# Patient Record
Sex: Female | Born: 1970 | Race: White | Hispanic: No | Marital: Married | Smoking: Never smoker
Health system: Southern US, Community
[De-identification: ages and names within clinical notes are randomized; demographics above are authoritative.]

## PROBLEM LIST (undated history)

## (undated) DIAGNOSIS — A4902 Methicillin resistant Staphylococcus aureus infection, unspecified site: Secondary | ICD-10-CM

## (undated) DIAGNOSIS — Z960 Presence of urogenital implants: Secondary | ICD-10-CM

## (undated) DIAGNOSIS — Z803 Family history of malignant neoplasm of breast: Secondary | ICD-10-CM

## (undated) DIAGNOSIS — Q059 Spina bifida, unspecified: Secondary | ICD-10-CM

## (undated) DIAGNOSIS — N390 Urinary tract infection, site not specified: Secondary | ICD-10-CM

## (undated) DIAGNOSIS — D500 Iron deficiency anemia secondary to blood loss (chronic): Secondary | ICD-10-CM

## (undated) DIAGNOSIS — L89302 Pressure ulcer of unspecified buttock, stage 2: Secondary | ICD-10-CM

## (undated) HISTORY — PX: BACK SURGERY: SHX140

## (undated) HISTORY — DX: Spina bifida, unspecified: Q05.9

## (undated) HISTORY — DX: Presence of urogenital implants: Z96.0

## (undated) HISTORY — DX: Methicillin resistant Staphylococcus aureus infection, unspecified site: A49.02

## (undated) HISTORY — PX: FOOT SURGERY: SHX648

## (undated) HISTORY — DX: Urinary tract infection, site not specified: N39.0

## (undated) HISTORY — DX: Family history of malignant neoplasm of breast: Z80.3

## (undated) HISTORY — DX: Pressure ulcer of unspecified buttock, stage 2: L89.302

---
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
## 1994-04-21 HISTORY — PX: TUBAL LIGATION: SHX77

## 1999-03-26 LAB — HM PAP SMEAR: HM Pap smear: NORMAL

## 2004-04-21 DIAGNOSIS — Z960 Presence of urogenital implants: Secondary | ICD-10-CM

## 2004-04-21 HISTORY — DX: Presence of urogenital implants: Z96.0

## 2006-06-01 ENCOUNTER — Emergency Department: Payer: Self-pay | Admitting: Internal Medicine

## 2006-06-01 ENCOUNTER — Other Ambulatory Visit: Payer: Self-pay

## 2010-04-21 HISTORY — PX: HIP SURGERY: SHX245

## 2011-08-01 DIAGNOSIS — E559 Vitamin D deficiency, unspecified: Secondary | ICD-10-CM | POA: Diagnosis not present

## 2011-08-01 DIAGNOSIS — Z1231 Encounter for screening mammogram for malignant neoplasm of breast: Secondary | ICD-10-CM | POA: Diagnosis not present

## 2011-08-01 DIAGNOSIS — Z1239 Encounter for other screening for malignant neoplasm of breast: Secondary | ICD-10-CM | POA: Diagnosis not present

## 2011-08-01 DIAGNOSIS — Z1389 Encounter for screening for other disorder: Secondary | ICD-10-CM | POA: Diagnosis not present

## 2011-08-01 DIAGNOSIS — Z01419 Encounter for gynecological examination (general) (routine) without abnormal findings: Secondary | ICD-10-CM | POA: Diagnosis not present

## 2011-08-12 DIAGNOSIS — N390 Urinary tract infection, site not specified: Secondary | ICD-10-CM | POA: Diagnosis not present

## 2012-05-21 DIAGNOSIS — J069 Acute upper respiratory infection, unspecified: Secondary | ICD-10-CM | POA: Diagnosis not present

## 2012-09-27 DIAGNOSIS — Z111 Encounter for screening for respiratory tuberculosis: Secondary | ICD-10-CM | POA: Diagnosis not present

## 2012-09-27 DIAGNOSIS — Z23 Encounter for immunization: Secondary | ICD-10-CM | POA: Diagnosis not present

## 2012-09-27 DIAGNOSIS — Z124 Encounter for screening for malignant neoplasm of cervix: Secondary | ICD-10-CM | POA: Diagnosis not present

## 2012-10-28 DIAGNOSIS — Z111 Encounter for screening for respiratory tuberculosis: Secondary | ICD-10-CM | POA: Diagnosis not present

## 2012-10-28 DIAGNOSIS — Z23 Encounter for immunization: Secondary | ICD-10-CM | POA: Diagnosis not present

## 2013-01-07 DIAGNOSIS — R51 Headache: Secondary | ICD-10-CM | POA: Diagnosis not present

## 2013-01-07 DIAGNOSIS — M9981 Other biomechanical lesions of cervical region: Secondary | ICD-10-CM | POA: Diagnosis not present

## 2013-01-07 DIAGNOSIS — M26609 Unspecified temporomandibular joint disorder, unspecified side: Secondary | ICD-10-CM | POA: Diagnosis not present

## 2013-01-07 DIAGNOSIS — M25519 Pain in unspecified shoulder: Secondary | ICD-10-CM | POA: Diagnosis not present

## 2013-01-07 DIAGNOSIS — M5020 Other cervical disc displacement, unspecified cervical region: Secondary | ICD-10-CM | POA: Diagnosis not present

## 2013-01-07 DIAGNOSIS — M999 Biomechanical lesion, unspecified: Secondary | ICD-10-CM | POA: Diagnosis not present

## 2013-01-07 DIAGNOSIS — G44209 Tension-type headache, unspecified, not intractable: Secondary | ICD-10-CM | POA: Diagnosis not present

## 2013-01-07 DIAGNOSIS — M546 Pain in thoracic spine: Secondary | ICD-10-CM | POA: Diagnosis not present

## 2013-01-10 DIAGNOSIS — G44209 Tension-type headache, unspecified, not intractable: Secondary | ICD-10-CM | POA: Diagnosis not present

## 2013-01-10 DIAGNOSIS — M999 Biomechanical lesion, unspecified: Secondary | ICD-10-CM | POA: Diagnosis not present

## 2013-01-10 DIAGNOSIS — M25519 Pain in unspecified shoulder: Secondary | ICD-10-CM | POA: Diagnosis not present

## 2013-01-10 DIAGNOSIS — M26609 Unspecified temporomandibular joint disorder, unspecified side: Secondary | ICD-10-CM | POA: Diagnosis not present

## 2013-01-10 DIAGNOSIS — M9981 Other biomechanical lesions of cervical region: Secondary | ICD-10-CM | POA: Diagnosis not present

## 2013-01-10 DIAGNOSIS — M546 Pain in thoracic spine: Secondary | ICD-10-CM | POA: Diagnosis not present

## 2013-01-10 DIAGNOSIS — M5020 Other cervical disc displacement, unspecified cervical region: Secondary | ICD-10-CM | POA: Diagnosis not present

## 2013-01-10 DIAGNOSIS — R51 Headache: Secondary | ICD-10-CM | POA: Diagnosis not present

## 2013-01-12 DIAGNOSIS — M9981 Other biomechanical lesions of cervical region: Secondary | ICD-10-CM | POA: Diagnosis not present

## 2013-01-12 DIAGNOSIS — G44209 Tension-type headache, unspecified, not intractable: Secondary | ICD-10-CM | POA: Diagnosis not present

## 2013-01-12 DIAGNOSIS — M26609 Unspecified temporomandibular joint disorder, unspecified side: Secondary | ICD-10-CM | POA: Diagnosis not present

## 2013-01-12 DIAGNOSIS — M999 Biomechanical lesion, unspecified: Secondary | ICD-10-CM | POA: Diagnosis not present

## 2013-01-12 DIAGNOSIS — R51 Headache: Secondary | ICD-10-CM | POA: Diagnosis not present

## 2013-01-12 DIAGNOSIS — M5020 Other cervical disc displacement, unspecified cervical region: Secondary | ICD-10-CM | POA: Diagnosis not present

## 2013-01-12 DIAGNOSIS — M546 Pain in thoracic spine: Secondary | ICD-10-CM | POA: Diagnosis not present

## 2013-01-12 DIAGNOSIS — M25519 Pain in unspecified shoulder: Secondary | ICD-10-CM | POA: Diagnosis not present

## 2013-01-14 DIAGNOSIS — M546 Pain in thoracic spine: Secondary | ICD-10-CM | POA: Diagnosis not present

## 2013-01-14 DIAGNOSIS — G44209 Tension-type headache, unspecified, not intractable: Secondary | ICD-10-CM | POA: Diagnosis not present

## 2013-01-14 DIAGNOSIS — M26609 Unspecified temporomandibular joint disorder, unspecified side: Secondary | ICD-10-CM | POA: Diagnosis not present

## 2013-01-14 DIAGNOSIS — M999 Biomechanical lesion, unspecified: Secondary | ICD-10-CM | POA: Diagnosis not present

## 2013-01-14 DIAGNOSIS — R51 Headache: Secondary | ICD-10-CM | POA: Diagnosis not present

## 2013-01-14 DIAGNOSIS — M9981 Other biomechanical lesions of cervical region: Secondary | ICD-10-CM | POA: Diagnosis not present

## 2013-01-14 DIAGNOSIS — M5020 Other cervical disc displacement, unspecified cervical region: Secondary | ICD-10-CM | POA: Diagnosis not present

## 2013-01-14 DIAGNOSIS — M25519 Pain in unspecified shoulder: Secondary | ICD-10-CM | POA: Diagnosis not present

## 2013-01-17 DIAGNOSIS — M9981 Other biomechanical lesions of cervical region: Secondary | ICD-10-CM | POA: Diagnosis not present

## 2013-01-17 DIAGNOSIS — M5020 Other cervical disc displacement, unspecified cervical region: Secondary | ICD-10-CM | POA: Diagnosis not present

## 2013-01-17 DIAGNOSIS — M25519 Pain in unspecified shoulder: Secondary | ICD-10-CM | POA: Diagnosis not present

## 2013-01-17 DIAGNOSIS — R51 Headache: Secondary | ICD-10-CM | POA: Diagnosis not present

## 2013-01-17 DIAGNOSIS — G44209 Tension-type headache, unspecified, not intractable: Secondary | ICD-10-CM | POA: Diagnosis not present

## 2013-01-17 DIAGNOSIS — M546 Pain in thoracic spine: Secondary | ICD-10-CM | POA: Diagnosis not present

## 2013-01-17 DIAGNOSIS — M999 Biomechanical lesion, unspecified: Secondary | ICD-10-CM | POA: Diagnosis not present

## 2013-01-17 DIAGNOSIS — M26609 Unspecified temporomandibular joint disorder, unspecified side: Secondary | ICD-10-CM | POA: Diagnosis not present

## 2013-01-19 DIAGNOSIS — G44209 Tension-type headache, unspecified, not intractable: Secondary | ICD-10-CM | POA: Diagnosis not present

## 2013-01-19 DIAGNOSIS — M25519 Pain in unspecified shoulder: Secondary | ICD-10-CM | POA: Diagnosis not present

## 2013-01-19 DIAGNOSIS — M999 Biomechanical lesion, unspecified: Secondary | ICD-10-CM | POA: Diagnosis not present

## 2013-01-19 DIAGNOSIS — M26609 Unspecified temporomandibular joint disorder, unspecified side: Secondary | ICD-10-CM | POA: Diagnosis not present

## 2013-01-19 DIAGNOSIS — M546 Pain in thoracic spine: Secondary | ICD-10-CM | POA: Diagnosis not present

## 2013-01-19 DIAGNOSIS — M5020 Other cervical disc displacement, unspecified cervical region: Secondary | ICD-10-CM | POA: Diagnosis not present

## 2013-01-19 DIAGNOSIS — R51 Headache: Secondary | ICD-10-CM | POA: Diagnosis not present

## 2013-01-19 DIAGNOSIS — M9981 Other biomechanical lesions of cervical region: Secondary | ICD-10-CM | POA: Diagnosis not present

## 2013-01-24 DIAGNOSIS — M5020 Other cervical disc displacement, unspecified cervical region: Secondary | ICD-10-CM | POA: Diagnosis not present

## 2013-01-24 DIAGNOSIS — M25519 Pain in unspecified shoulder: Secondary | ICD-10-CM | POA: Diagnosis not present

## 2013-01-24 DIAGNOSIS — R51 Headache: Secondary | ICD-10-CM | POA: Diagnosis not present

## 2013-01-24 DIAGNOSIS — G44209 Tension-type headache, unspecified, not intractable: Secondary | ICD-10-CM | POA: Diagnosis not present

## 2013-01-24 DIAGNOSIS — M546 Pain in thoracic spine: Secondary | ICD-10-CM | POA: Diagnosis not present

## 2013-01-24 DIAGNOSIS — M999 Biomechanical lesion, unspecified: Secondary | ICD-10-CM | POA: Diagnosis not present

## 2013-01-24 DIAGNOSIS — M9981 Other biomechanical lesions of cervical region: Secondary | ICD-10-CM | POA: Diagnosis not present

## 2013-01-24 DIAGNOSIS — M26609 Unspecified temporomandibular joint disorder, unspecified side: Secondary | ICD-10-CM | POA: Diagnosis not present

## 2013-01-26 DIAGNOSIS — M546 Pain in thoracic spine: Secondary | ICD-10-CM | POA: Diagnosis not present

## 2013-01-26 DIAGNOSIS — M5020 Other cervical disc displacement, unspecified cervical region: Secondary | ICD-10-CM | POA: Diagnosis not present

## 2013-01-26 DIAGNOSIS — M999 Biomechanical lesion, unspecified: Secondary | ICD-10-CM | POA: Diagnosis not present

## 2013-01-26 DIAGNOSIS — M25519 Pain in unspecified shoulder: Secondary | ICD-10-CM | POA: Diagnosis not present

## 2013-01-26 DIAGNOSIS — R51 Headache: Secondary | ICD-10-CM | POA: Diagnosis not present

## 2013-01-26 DIAGNOSIS — M26609 Unspecified temporomandibular joint disorder, unspecified side: Secondary | ICD-10-CM | POA: Diagnosis not present

## 2013-01-26 DIAGNOSIS — G44209 Tension-type headache, unspecified, not intractable: Secondary | ICD-10-CM | POA: Diagnosis not present

## 2013-01-26 DIAGNOSIS — M9981 Other biomechanical lesions of cervical region: Secondary | ICD-10-CM | POA: Diagnosis not present

## 2013-01-28 DIAGNOSIS — G44209 Tension-type headache, unspecified, not intractable: Secondary | ICD-10-CM | POA: Diagnosis not present

## 2013-01-28 DIAGNOSIS — M9981 Other biomechanical lesions of cervical region: Secondary | ICD-10-CM | POA: Diagnosis not present

## 2013-01-28 DIAGNOSIS — M5020 Other cervical disc displacement, unspecified cervical region: Secondary | ICD-10-CM | POA: Diagnosis not present

## 2013-01-28 DIAGNOSIS — R51 Headache: Secondary | ICD-10-CM | POA: Diagnosis not present

## 2013-01-28 DIAGNOSIS — M999 Biomechanical lesion, unspecified: Secondary | ICD-10-CM | POA: Diagnosis not present

## 2013-01-28 DIAGNOSIS — M25519 Pain in unspecified shoulder: Secondary | ICD-10-CM | POA: Diagnosis not present

## 2013-01-28 DIAGNOSIS — M546 Pain in thoracic spine: Secondary | ICD-10-CM | POA: Diagnosis not present

## 2013-01-28 DIAGNOSIS — M26609 Unspecified temporomandibular joint disorder, unspecified side: Secondary | ICD-10-CM | POA: Diagnosis not present

## 2013-01-31 DIAGNOSIS — M26609 Unspecified temporomandibular joint disorder, unspecified side: Secondary | ICD-10-CM | POA: Diagnosis not present

## 2013-01-31 DIAGNOSIS — R51 Headache: Secondary | ICD-10-CM | POA: Diagnosis not present

## 2013-01-31 DIAGNOSIS — M25519 Pain in unspecified shoulder: Secondary | ICD-10-CM | POA: Diagnosis not present

## 2013-01-31 DIAGNOSIS — M5020 Other cervical disc displacement, unspecified cervical region: Secondary | ICD-10-CM | POA: Diagnosis not present

## 2013-01-31 DIAGNOSIS — M9981 Other biomechanical lesions of cervical region: Secondary | ICD-10-CM | POA: Diagnosis not present

## 2013-01-31 DIAGNOSIS — G44209 Tension-type headache, unspecified, not intractable: Secondary | ICD-10-CM | POA: Diagnosis not present

## 2013-01-31 DIAGNOSIS — M999 Biomechanical lesion, unspecified: Secondary | ICD-10-CM | POA: Diagnosis not present

## 2013-01-31 DIAGNOSIS — M546 Pain in thoracic spine: Secondary | ICD-10-CM | POA: Diagnosis not present

## 2013-02-02 DIAGNOSIS — M26609 Unspecified temporomandibular joint disorder, unspecified side: Secondary | ICD-10-CM | POA: Diagnosis not present

## 2013-02-02 DIAGNOSIS — M546 Pain in thoracic spine: Secondary | ICD-10-CM | POA: Diagnosis not present

## 2013-02-02 DIAGNOSIS — M25519 Pain in unspecified shoulder: Secondary | ICD-10-CM | POA: Diagnosis not present

## 2013-02-02 DIAGNOSIS — G44209 Tension-type headache, unspecified, not intractable: Secondary | ICD-10-CM | POA: Diagnosis not present

## 2013-02-02 DIAGNOSIS — M999 Biomechanical lesion, unspecified: Secondary | ICD-10-CM | POA: Diagnosis not present

## 2013-02-02 DIAGNOSIS — M5020 Other cervical disc displacement, unspecified cervical region: Secondary | ICD-10-CM | POA: Diagnosis not present

## 2013-02-02 DIAGNOSIS — R51 Headache: Secondary | ICD-10-CM | POA: Diagnosis not present

## 2013-02-02 DIAGNOSIS — M9981 Other biomechanical lesions of cervical region: Secondary | ICD-10-CM | POA: Diagnosis not present

## 2013-02-04 DIAGNOSIS — G44209 Tension-type headache, unspecified, not intractable: Secondary | ICD-10-CM | POA: Diagnosis not present

## 2013-02-04 DIAGNOSIS — M9981 Other biomechanical lesions of cervical region: Secondary | ICD-10-CM | POA: Diagnosis not present

## 2013-02-04 DIAGNOSIS — M546 Pain in thoracic spine: Secondary | ICD-10-CM | POA: Diagnosis not present

## 2013-02-04 DIAGNOSIS — M25519 Pain in unspecified shoulder: Secondary | ICD-10-CM | POA: Diagnosis not present

## 2013-02-04 DIAGNOSIS — R51 Headache: Secondary | ICD-10-CM | POA: Diagnosis not present

## 2013-02-04 DIAGNOSIS — M5020 Other cervical disc displacement, unspecified cervical region: Secondary | ICD-10-CM | POA: Diagnosis not present

## 2013-02-04 DIAGNOSIS — M999 Biomechanical lesion, unspecified: Secondary | ICD-10-CM | POA: Diagnosis not present

## 2013-02-04 DIAGNOSIS — M26609 Unspecified temporomandibular joint disorder, unspecified side: Secondary | ICD-10-CM | POA: Diagnosis not present

## 2013-02-07 DIAGNOSIS — M5020 Other cervical disc displacement, unspecified cervical region: Secondary | ICD-10-CM | POA: Diagnosis not present

## 2013-02-07 DIAGNOSIS — M999 Biomechanical lesion, unspecified: Secondary | ICD-10-CM | POA: Diagnosis not present

## 2013-02-07 DIAGNOSIS — M9981 Other biomechanical lesions of cervical region: Secondary | ICD-10-CM | POA: Diagnosis not present

## 2013-02-07 DIAGNOSIS — M25519 Pain in unspecified shoulder: Secondary | ICD-10-CM | POA: Diagnosis not present

## 2013-02-07 DIAGNOSIS — M546 Pain in thoracic spine: Secondary | ICD-10-CM | POA: Diagnosis not present

## 2013-02-07 DIAGNOSIS — G44209 Tension-type headache, unspecified, not intractable: Secondary | ICD-10-CM | POA: Diagnosis not present

## 2013-02-07 DIAGNOSIS — R51 Headache: Secondary | ICD-10-CM | POA: Diagnosis not present

## 2013-02-07 DIAGNOSIS — M26609 Unspecified temporomandibular joint disorder, unspecified side: Secondary | ICD-10-CM | POA: Diagnosis not present

## 2013-02-09 DIAGNOSIS — M26609 Unspecified temporomandibular joint disorder, unspecified side: Secondary | ICD-10-CM | POA: Diagnosis not present

## 2013-02-09 DIAGNOSIS — R51 Headache: Secondary | ICD-10-CM | POA: Diagnosis not present

## 2013-02-09 DIAGNOSIS — M999 Biomechanical lesion, unspecified: Secondary | ICD-10-CM | POA: Diagnosis not present

## 2013-02-09 DIAGNOSIS — M9981 Other biomechanical lesions of cervical region: Secondary | ICD-10-CM | POA: Diagnosis not present

## 2013-02-09 DIAGNOSIS — M5020 Other cervical disc displacement, unspecified cervical region: Secondary | ICD-10-CM | POA: Diagnosis not present

## 2013-02-09 DIAGNOSIS — G44209 Tension-type headache, unspecified, not intractable: Secondary | ICD-10-CM | POA: Diagnosis not present

## 2013-02-09 DIAGNOSIS — M25519 Pain in unspecified shoulder: Secondary | ICD-10-CM | POA: Diagnosis not present

## 2013-02-09 DIAGNOSIS — M546 Pain in thoracic spine: Secondary | ICD-10-CM | POA: Diagnosis not present

## 2013-02-11 DIAGNOSIS — M25519 Pain in unspecified shoulder: Secondary | ICD-10-CM | POA: Diagnosis not present

## 2013-02-11 DIAGNOSIS — R51 Headache: Secondary | ICD-10-CM | POA: Diagnosis not present

## 2013-02-11 DIAGNOSIS — G44209 Tension-type headache, unspecified, not intractable: Secondary | ICD-10-CM | POA: Diagnosis not present

## 2013-02-11 DIAGNOSIS — M546 Pain in thoracic spine: Secondary | ICD-10-CM | POA: Diagnosis not present

## 2013-02-11 DIAGNOSIS — M999 Biomechanical lesion, unspecified: Secondary | ICD-10-CM | POA: Diagnosis not present

## 2013-02-11 DIAGNOSIS — M26609 Unspecified temporomandibular joint disorder, unspecified side: Secondary | ICD-10-CM | POA: Diagnosis not present

## 2013-02-11 DIAGNOSIS — M5020 Other cervical disc displacement, unspecified cervical region: Secondary | ICD-10-CM | POA: Diagnosis not present

## 2013-02-11 DIAGNOSIS — M9981 Other biomechanical lesions of cervical region: Secondary | ICD-10-CM | POA: Diagnosis not present

## 2013-02-14 DIAGNOSIS — M9981 Other biomechanical lesions of cervical region: Secondary | ICD-10-CM | POA: Diagnosis not present

## 2013-02-14 DIAGNOSIS — M546 Pain in thoracic spine: Secondary | ICD-10-CM | POA: Diagnosis not present

## 2013-02-14 DIAGNOSIS — R51 Headache: Secondary | ICD-10-CM | POA: Diagnosis not present

## 2013-02-14 DIAGNOSIS — M5020 Other cervical disc displacement, unspecified cervical region: Secondary | ICD-10-CM | POA: Diagnosis not present

## 2013-02-14 DIAGNOSIS — M25519 Pain in unspecified shoulder: Secondary | ICD-10-CM | POA: Diagnosis not present

## 2013-02-14 DIAGNOSIS — M999 Biomechanical lesion, unspecified: Secondary | ICD-10-CM | POA: Diagnosis not present

## 2013-02-14 DIAGNOSIS — M26609 Unspecified temporomandibular joint disorder, unspecified side: Secondary | ICD-10-CM | POA: Diagnosis not present

## 2013-02-14 DIAGNOSIS — G44209 Tension-type headache, unspecified, not intractable: Secondary | ICD-10-CM | POA: Diagnosis not present

## 2013-02-16 DIAGNOSIS — G44209 Tension-type headache, unspecified, not intractable: Secondary | ICD-10-CM | POA: Diagnosis not present

## 2013-02-16 DIAGNOSIS — M25519 Pain in unspecified shoulder: Secondary | ICD-10-CM | POA: Diagnosis not present

## 2013-02-16 DIAGNOSIS — M5020 Other cervical disc displacement, unspecified cervical region: Secondary | ICD-10-CM | POA: Diagnosis not present

## 2013-02-16 DIAGNOSIS — M9981 Other biomechanical lesions of cervical region: Secondary | ICD-10-CM | POA: Diagnosis not present

## 2013-02-16 DIAGNOSIS — M26609 Unspecified temporomandibular joint disorder, unspecified side: Secondary | ICD-10-CM | POA: Diagnosis not present

## 2013-02-16 DIAGNOSIS — R51 Headache: Secondary | ICD-10-CM | POA: Diagnosis not present

## 2013-02-16 DIAGNOSIS — M546 Pain in thoracic spine: Secondary | ICD-10-CM | POA: Diagnosis not present

## 2013-02-16 DIAGNOSIS — M999 Biomechanical lesion, unspecified: Secondary | ICD-10-CM | POA: Diagnosis not present

## 2013-02-18 DIAGNOSIS — M546 Pain in thoracic spine: Secondary | ICD-10-CM | POA: Diagnosis not present

## 2013-02-18 DIAGNOSIS — G44209 Tension-type headache, unspecified, not intractable: Secondary | ICD-10-CM | POA: Diagnosis not present

## 2013-02-18 DIAGNOSIS — R51 Headache: Secondary | ICD-10-CM | POA: Diagnosis not present

## 2013-02-18 DIAGNOSIS — M9981 Other biomechanical lesions of cervical region: Secondary | ICD-10-CM | POA: Diagnosis not present

## 2013-02-18 DIAGNOSIS — M26609 Unspecified temporomandibular joint disorder, unspecified side: Secondary | ICD-10-CM | POA: Diagnosis not present

## 2013-02-18 DIAGNOSIS — M5020 Other cervical disc displacement, unspecified cervical region: Secondary | ICD-10-CM | POA: Diagnosis not present

## 2013-02-18 DIAGNOSIS — M999 Biomechanical lesion, unspecified: Secondary | ICD-10-CM | POA: Diagnosis not present

## 2013-02-18 DIAGNOSIS — M25519 Pain in unspecified shoulder: Secondary | ICD-10-CM | POA: Diagnosis not present

## 2013-02-21 DIAGNOSIS — G44209 Tension-type headache, unspecified, not intractable: Secondary | ICD-10-CM | POA: Diagnosis not present

## 2013-02-21 DIAGNOSIS — M546 Pain in thoracic spine: Secondary | ICD-10-CM | POA: Diagnosis not present

## 2013-02-21 DIAGNOSIS — M25519 Pain in unspecified shoulder: Secondary | ICD-10-CM | POA: Diagnosis not present

## 2013-02-21 DIAGNOSIS — R51 Headache: Secondary | ICD-10-CM | POA: Diagnosis not present

## 2013-02-21 DIAGNOSIS — M5020 Other cervical disc displacement, unspecified cervical region: Secondary | ICD-10-CM | POA: Diagnosis not present

## 2013-02-21 DIAGNOSIS — M9981 Other biomechanical lesions of cervical region: Secondary | ICD-10-CM | POA: Diagnosis not present

## 2013-02-21 DIAGNOSIS — M26609 Unspecified temporomandibular joint disorder, unspecified side: Secondary | ICD-10-CM | POA: Diagnosis not present

## 2013-02-21 DIAGNOSIS — M999 Biomechanical lesion, unspecified: Secondary | ICD-10-CM | POA: Diagnosis not present

## 2013-02-28 DIAGNOSIS — R51 Headache: Secondary | ICD-10-CM | POA: Diagnosis not present

## 2013-02-28 DIAGNOSIS — G44209 Tension-type headache, unspecified, not intractable: Secondary | ICD-10-CM | POA: Diagnosis not present

## 2013-02-28 DIAGNOSIS — M5020 Other cervical disc displacement, unspecified cervical region: Secondary | ICD-10-CM | POA: Diagnosis not present

## 2013-02-28 DIAGNOSIS — M999 Biomechanical lesion, unspecified: Secondary | ICD-10-CM | POA: Diagnosis not present

## 2013-02-28 DIAGNOSIS — M546 Pain in thoracic spine: Secondary | ICD-10-CM | POA: Diagnosis not present

## 2013-02-28 DIAGNOSIS — M9981 Other biomechanical lesions of cervical region: Secondary | ICD-10-CM | POA: Diagnosis not present

## 2013-02-28 DIAGNOSIS — M26609 Unspecified temporomandibular joint disorder, unspecified side: Secondary | ICD-10-CM | POA: Diagnosis not present

## 2013-02-28 DIAGNOSIS — M25519 Pain in unspecified shoulder: Secondary | ICD-10-CM | POA: Diagnosis not present

## 2013-03-07 DIAGNOSIS — M999 Biomechanical lesion, unspecified: Secondary | ICD-10-CM | POA: Diagnosis not present

## 2013-03-07 DIAGNOSIS — R51 Headache: Secondary | ICD-10-CM | POA: Diagnosis not present

## 2013-03-07 DIAGNOSIS — M9981 Other biomechanical lesions of cervical region: Secondary | ICD-10-CM | POA: Diagnosis not present

## 2013-03-07 DIAGNOSIS — M5020 Other cervical disc displacement, unspecified cervical region: Secondary | ICD-10-CM | POA: Diagnosis not present

## 2013-03-07 DIAGNOSIS — M546 Pain in thoracic spine: Secondary | ICD-10-CM | POA: Diagnosis not present

## 2013-03-07 DIAGNOSIS — M25519 Pain in unspecified shoulder: Secondary | ICD-10-CM | POA: Diagnosis not present

## 2013-03-07 DIAGNOSIS — M26609 Unspecified temporomandibular joint disorder, unspecified side: Secondary | ICD-10-CM | POA: Diagnosis not present

## 2013-03-07 DIAGNOSIS — G44209 Tension-type headache, unspecified, not intractable: Secondary | ICD-10-CM | POA: Diagnosis not present

## 2013-03-11 DIAGNOSIS — M5020 Other cervical disc displacement, unspecified cervical region: Secondary | ICD-10-CM | POA: Diagnosis not present

## 2013-03-11 DIAGNOSIS — R51 Headache: Secondary | ICD-10-CM | POA: Diagnosis not present

## 2013-03-11 DIAGNOSIS — M25519 Pain in unspecified shoulder: Secondary | ICD-10-CM | POA: Diagnosis not present

## 2013-03-11 DIAGNOSIS — M26609 Unspecified temporomandibular joint disorder, unspecified side: Secondary | ICD-10-CM | POA: Diagnosis not present

## 2013-03-11 DIAGNOSIS — M999 Biomechanical lesion, unspecified: Secondary | ICD-10-CM | POA: Diagnosis not present

## 2013-03-11 DIAGNOSIS — M546 Pain in thoracic spine: Secondary | ICD-10-CM | POA: Diagnosis not present

## 2013-03-11 DIAGNOSIS — M9981 Other biomechanical lesions of cervical region: Secondary | ICD-10-CM | POA: Diagnosis not present

## 2013-03-11 DIAGNOSIS — G44209 Tension-type headache, unspecified, not intractable: Secondary | ICD-10-CM | POA: Diagnosis not present

## 2013-03-14 DIAGNOSIS — M999 Biomechanical lesion, unspecified: Secondary | ICD-10-CM | POA: Diagnosis not present

## 2013-03-14 DIAGNOSIS — M25519 Pain in unspecified shoulder: Secondary | ICD-10-CM | POA: Diagnosis not present

## 2013-03-14 DIAGNOSIS — G44209 Tension-type headache, unspecified, not intractable: Secondary | ICD-10-CM | POA: Diagnosis not present

## 2013-03-14 DIAGNOSIS — M26609 Unspecified temporomandibular joint disorder, unspecified side: Secondary | ICD-10-CM | POA: Diagnosis not present

## 2013-03-14 DIAGNOSIS — R51 Headache: Secondary | ICD-10-CM | POA: Diagnosis not present

## 2013-03-14 DIAGNOSIS — M5020 Other cervical disc displacement, unspecified cervical region: Secondary | ICD-10-CM | POA: Diagnosis not present

## 2013-03-14 DIAGNOSIS — M546 Pain in thoracic spine: Secondary | ICD-10-CM | POA: Diagnosis not present

## 2013-03-14 DIAGNOSIS — M9981 Other biomechanical lesions of cervical region: Secondary | ICD-10-CM | POA: Diagnosis not present

## 2013-03-21 DIAGNOSIS — M25519 Pain in unspecified shoulder: Secondary | ICD-10-CM | POA: Diagnosis not present

## 2013-03-21 DIAGNOSIS — G44209 Tension-type headache, unspecified, not intractable: Secondary | ICD-10-CM | POA: Diagnosis not present

## 2013-03-21 DIAGNOSIS — M26609 Unspecified temporomandibular joint disorder, unspecified side: Secondary | ICD-10-CM | POA: Diagnosis not present

## 2013-03-21 DIAGNOSIS — M9981 Other biomechanical lesions of cervical region: Secondary | ICD-10-CM | POA: Diagnosis not present

## 2013-03-21 DIAGNOSIS — M999 Biomechanical lesion, unspecified: Secondary | ICD-10-CM | POA: Diagnosis not present

## 2013-03-21 DIAGNOSIS — R51 Headache: Secondary | ICD-10-CM | POA: Diagnosis not present

## 2013-03-21 DIAGNOSIS — M5020 Other cervical disc displacement, unspecified cervical region: Secondary | ICD-10-CM | POA: Diagnosis not present

## 2013-03-21 DIAGNOSIS — M546 Pain in thoracic spine: Secondary | ICD-10-CM | POA: Diagnosis not present

## 2013-04-11 DIAGNOSIS — Z111 Encounter for screening for respiratory tuberculosis: Secondary | ICD-10-CM | POA: Diagnosis not present

## 2013-04-11 DIAGNOSIS — Z23 Encounter for immunization: Secondary | ICD-10-CM | POA: Diagnosis not present

## 2013-05-02 DIAGNOSIS — M5020 Other cervical disc displacement, unspecified cervical region: Secondary | ICD-10-CM | POA: Diagnosis not present

## 2013-05-02 DIAGNOSIS — M25519 Pain in unspecified shoulder: Secondary | ICD-10-CM | POA: Diagnosis not present

## 2013-05-02 DIAGNOSIS — M9981 Other biomechanical lesions of cervical region: Secondary | ICD-10-CM | POA: Diagnosis not present

## 2013-05-02 DIAGNOSIS — M26609 Unspecified temporomandibular joint disorder, unspecified side: Secondary | ICD-10-CM | POA: Diagnosis not present

## 2013-05-02 DIAGNOSIS — R51 Headache: Secondary | ICD-10-CM | POA: Diagnosis not present

## 2013-05-02 DIAGNOSIS — G44209 Tension-type headache, unspecified, not intractable: Secondary | ICD-10-CM | POA: Diagnosis not present

## 2013-05-02 DIAGNOSIS — M999 Biomechanical lesion, unspecified: Secondary | ICD-10-CM | POA: Diagnosis not present

## 2013-05-02 DIAGNOSIS — M546 Pain in thoracic spine: Secondary | ICD-10-CM | POA: Diagnosis not present

## 2013-05-16 DIAGNOSIS — M546 Pain in thoracic spine: Secondary | ICD-10-CM | POA: Diagnosis not present

## 2013-05-16 DIAGNOSIS — M5020 Other cervical disc displacement, unspecified cervical region: Secondary | ICD-10-CM | POA: Diagnosis not present

## 2013-05-16 DIAGNOSIS — M25519 Pain in unspecified shoulder: Secondary | ICD-10-CM | POA: Diagnosis not present

## 2013-05-16 DIAGNOSIS — M9981 Other biomechanical lesions of cervical region: Secondary | ICD-10-CM | POA: Diagnosis not present

## 2013-05-16 DIAGNOSIS — G44209 Tension-type headache, unspecified, not intractable: Secondary | ICD-10-CM | POA: Diagnosis not present

## 2013-05-16 DIAGNOSIS — M999 Biomechanical lesion, unspecified: Secondary | ICD-10-CM | POA: Diagnosis not present

## 2013-05-16 DIAGNOSIS — R51 Headache: Secondary | ICD-10-CM | POA: Diagnosis not present

## 2013-05-16 DIAGNOSIS — M26609 Unspecified temporomandibular joint disorder, unspecified side: Secondary | ICD-10-CM | POA: Diagnosis not present

## 2013-06-06 DIAGNOSIS — M999 Biomechanical lesion, unspecified: Secondary | ICD-10-CM | POA: Diagnosis not present

## 2013-06-06 DIAGNOSIS — G44209 Tension-type headache, unspecified, not intractable: Secondary | ICD-10-CM | POA: Diagnosis not present

## 2013-06-06 DIAGNOSIS — M546 Pain in thoracic spine: Secondary | ICD-10-CM | POA: Diagnosis not present

## 2013-06-06 DIAGNOSIS — M9981 Other biomechanical lesions of cervical region: Secondary | ICD-10-CM | POA: Diagnosis not present

## 2013-06-06 DIAGNOSIS — M5020 Other cervical disc displacement, unspecified cervical region: Secondary | ICD-10-CM | POA: Diagnosis not present

## 2013-06-06 DIAGNOSIS — M26609 Unspecified temporomandibular joint disorder, unspecified side: Secondary | ICD-10-CM | POA: Diagnosis not present

## 2013-06-06 DIAGNOSIS — R51 Headache: Secondary | ICD-10-CM | POA: Diagnosis not present

## 2013-06-06 DIAGNOSIS — M25519 Pain in unspecified shoulder: Secondary | ICD-10-CM | POA: Diagnosis not present

## 2013-06-20 DIAGNOSIS — G44209 Tension-type headache, unspecified, not intractable: Secondary | ICD-10-CM | POA: Diagnosis not present

## 2013-06-20 DIAGNOSIS — M9981 Other biomechanical lesions of cervical region: Secondary | ICD-10-CM | POA: Diagnosis not present

## 2013-06-20 DIAGNOSIS — M999 Biomechanical lesion, unspecified: Secondary | ICD-10-CM | POA: Diagnosis not present

## 2013-06-20 DIAGNOSIS — M546 Pain in thoracic spine: Secondary | ICD-10-CM | POA: Diagnosis not present

## 2013-06-20 DIAGNOSIS — R51 Headache: Secondary | ICD-10-CM | POA: Diagnosis not present

## 2013-06-20 DIAGNOSIS — M25519 Pain in unspecified shoulder: Secondary | ICD-10-CM | POA: Diagnosis not present

## 2013-06-20 DIAGNOSIS — M26609 Unspecified temporomandibular joint disorder, unspecified side: Secondary | ICD-10-CM | POA: Diagnosis not present

## 2013-06-20 DIAGNOSIS — M5020 Other cervical disc displacement, unspecified cervical region: Secondary | ICD-10-CM | POA: Diagnosis not present

## 2013-07-04 DIAGNOSIS — M5020 Other cervical disc displacement, unspecified cervical region: Secondary | ICD-10-CM | POA: Diagnosis not present

## 2013-07-04 DIAGNOSIS — M26609 Unspecified temporomandibular joint disorder, unspecified side: Secondary | ICD-10-CM | POA: Diagnosis not present

## 2013-07-04 DIAGNOSIS — R51 Headache: Secondary | ICD-10-CM | POA: Diagnosis not present

## 2013-07-04 DIAGNOSIS — M25519 Pain in unspecified shoulder: Secondary | ICD-10-CM | POA: Diagnosis not present

## 2013-07-04 DIAGNOSIS — M999 Biomechanical lesion, unspecified: Secondary | ICD-10-CM | POA: Diagnosis not present

## 2013-07-04 DIAGNOSIS — G44209 Tension-type headache, unspecified, not intractable: Secondary | ICD-10-CM | POA: Diagnosis not present

## 2013-07-04 DIAGNOSIS — M9981 Other biomechanical lesions of cervical region: Secondary | ICD-10-CM | POA: Diagnosis not present

## 2013-07-04 DIAGNOSIS — M546 Pain in thoracic spine: Secondary | ICD-10-CM | POA: Diagnosis not present

## 2013-07-18 DIAGNOSIS — M999 Biomechanical lesion, unspecified: Secondary | ICD-10-CM | POA: Diagnosis not present

## 2013-07-18 DIAGNOSIS — M5020 Other cervical disc displacement, unspecified cervical region: Secondary | ICD-10-CM | POA: Diagnosis not present

## 2013-07-18 DIAGNOSIS — R51 Headache: Secondary | ICD-10-CM | POA: Diagnosis not present

## 2013-07-18 DIAGNOSIS — G44209 Tension-type headache, unspecified, not intractable: Secondary | ICD-10-CM | POA: Diagnosis not present

## 2013-07-18 DIAGNOSIS — M546 Pain in thoracic spine: Secondary | ICD-10-CM | POA: Diagnosis not present

## 2013-07-18 DIAGNOSIS — M9981 Other biomechanical lesions of cervical region: Secondary | ICD-10-CM | POA: Diagnosis not present

## 2013-07-18 DIAGNOSIS — M25519 Pain in unspecified shoulder: Secondary | ICD-10-CM | POA: Diagnosis not present

## 2013-07-18 DIAGNOSIS — M26609 Unspecified temporomandibular joint disorder, unspecified side: Secondary | ICD-10-CM | POA: Diagnosis not present

## 2013-08-01 DIAGNOSIS — M26609 Unspecified temporomandibular joint disorder, unspecified side: Secondary | ICD-10-CM | POA: Diagnosis not present

## 2013-08-01 DIAGNOSIS — G44209 Tension-type headache, unspecified, not intractable: Secondary | ICD-10-CM | POA: Diagnosis not present

## 2013-08-01 DIAGNOSIS — R51 Headache: Secondary | ICD-10-CM | POA: Diagnosis not present

## 2013-08-01 DIAGNOSIS — M999 Biomechanical lesion, unspecified: Secondary | ICD-10-CM | POA: Diagnosis not present

## 2013-08-01 DIAGNOSIS — M9981 Other biomechanical lesions of cervical region: Secondary | ICD-10-CM | POA: Diagnosis not present

## 2013-08-01 DIAGNOSIS — M546 Pain in thoracic spine: Secondary | ICD-10-CM | POA: Diagnosis not present

## 2013-08-01 DIAGNOSIS — M5020 Other cervical disc displacement, unspecified cervical region: Secondary | ICD-10-CM | POA: Diagnosis not present

## 2013-08-01 DIAGNOSIS — M25519 Pain in unspecified shoulder: Secondary | ICD-10-CM | POA: Diagnosis not present

## 2013-08-15 DIAGNOSIS — M999 Biomechanical lesion, unspecified: Secondary | ICD-10-CM | POA: Diagnosis not present

## 2013-08-15 DIAGNOSIS — M26609 Unspecified temporomandibular joint disorder, unspecified side: Secondary | ICD-10-CM | POA: Diagnosis not present

## 2013-08-15 DIAGNOSIS — M25519 Pain in unspecified shoulder: Secondary | ICD-10-CM | POA: Diagnosis not present

## 2013-08-15 DIAGNOSIS — M546 Pain in thoracic spine: Secondary | ICD-10-CM | POA: Diagnosis not present

## 2013-08-15 DIAGNOSIS — M5020 Other cervical disc displacement, unspecified cervical region: Secondary | ICD-10-CM | POA: Diagnosis not present

## 2013-08-15 DIAGNOSIS — R51 Headache: Secondary | ICD-10-CM | POA: Diagnosis not present

## 2013-08-15 DIAGNOSIS — M9981 Other biomechanical lesions of cervical region: Secondary | ICD-10-CM | POA: Diagnosis not present

## 2013-08-15 DIAGNOSIS — G44209 Tension-type headache, unspecified, not intractable: Secondary | ICD-10-CM | POA: Diagnosis not present

## 2013-08-29 DIAGNOSIS — R51 Headache: Secondary | ICD-10-CM | POA: Diagnosis not present

## 2013-08-29 DIAGNOSIS — M26609 Unspecified temporomandibular joint disorder, unspecified side: Secondary | ICD-10-CM | POA: Diagnosis not present

## 2013-08-29 DIAGNOSIS — M5020 Other cervical disc displacement, unspecified cervical region: Secondary | ICD-10-CM | POA: Diagnosis not present

## 2013-08-29 DIAGNOSIS — G44209 Tension-type headache, unspecified, not intractable: Secondary | ICD-10-CM | POA: Diagnosis not present

## 2013-08-29 DIAGNOSIS — M9981 Other biomechanical lesions of cervical region: Secondary | ICD-10-CM | POA: Diagnosis not present

## 2013-08-29 DIAGNOSIS — M546 Pain in thoracic spine: Secondary | ICD-10-CM | POA: Diagnosis not present

## 2013-08-29 DIAGNOSIS — M999 Biomechanical lesion, unspecified: Secondary | ICD-10-CM | POA: Diagnosis not present

## 2013-08-29 DIAGNOSIS — M25519 Pain in unspecified shoulder: Secondary | ICD-10-CM | POA: Diagnosis not present

## 2013-09-13 DIAGNOSIS — R51 Headache: Secondary | ICD-10-CM | POA: Diagnosis not present

## 2013-09-13 DIAGNOSIS — M26609 Unspecified temporomandibular joint disorder, unspecified side: Secondary | ICD-10-CM | POA: Diagnosis not present

## 2013-09-13 DIAGNOSIS — M9981 Other biomechanical lesions of cervical region: Secondary | ICD-10-CM | POA: Diagnosis not present

## 2013-09-13 DIAGNOSIS — M25519 Pain in unspecified shoulder: Secondary | ICD-10-CM | POA: Diagnosis not present

## 2013-09-13 DIAGNOSIS — M999 Biomechanical lesion, unspecified: Secondary | ICD-10-CM | POA: Diagnosis not present

## 2013-09-13 DIAGNOSIS — M5020 Other cervical disc displacement, unspecified cervical region: Secondary | ICD-10-CM | POA: Diagnosis not present

## 2013-09-13 DIAGNOSIS — M546 Pain in thoracic spine: Secondary | ICD-10-CM | POA: Diagnosis not present

## 2013-09-13 DIAGNOSIS — G44209 Tension-type headache, unspecified, not intractable: Secondary | ICD-10-CM | POA: Diagnosis not present

## 2013-09-26 DIAGNOSIS — M25519 Pain in unspecified shoulder: Secondary | ICD-10-CM | POA: Diagnosis not present

## 2013-09-26 DIAGNOSIS — G44209 Tension-type headache, unspecified, not intractable: Secondary | ICD-10-CM | POA: Diagnosis not present

## 2013-09-26 DIAGNOSIS — M9981 Other biomechanical lesions of cervical region: Secondary | ICD-10-CM | POA: Diagnosis not present

## 2013-09-26 DIAGNOSIS — M5020 Other cervical disc displacement, unspecified cervical region: Secondary | ICD-10-CM | POA: Diagnosis not present

## 2013-09-26 DIAGNOSIS — M26609 Unspecified temporomandibular joint disorder, unspecified side: Secondary | ICD-10-CM | POA: Diagnosis not present

## 2013-09-26 DIAGNOSIS — M999 Biomechanical lesion, unspecified: Secondary | ICD-10-CM | POA: Diagnosis not present

## 2013-09-26 DIAGNOSIS — M546 Pain in thoracic spine: Secondary | ICD-10-CM | POA: Diagnosis not present

## 2013-09-26 DIAGNOSIS — R51 Headache: Secondary | ICD-10-CM | POA: Diagnosis not present

## 2013-10-17 DIAGNOSIS — M546 Pain in thoracic spine: Secondary | ICD-10-CM | POA: Diagnosis not present

## 2013-10-17 DIAGNOSIS — R51 Headache: Secondary | ICD-10-CM | POA: Diagnosis not present

## 2013-10-17 DIAGNOSIS — M25519 Pain in unspecified shoulder: Secondary | ICD-10-CM | POA: Diagnosis not present

## 2013-10-17 DIAGNOSIS — M9981 Other biomechanical lesions of cervical region: Secondary | ICD-10-CM | POA: Diagnosis not present

## 2013-10-17 DIAGNOSIS — G44209 Tension-type headache, unspecified, not intractable: Secondary | ICD-10-CM | POA: Diagnosis not present

## 2013-10-17 DIAGNOSIS — M999 Biomechanical lesion, unspecified: Secondary | ICD-10-CM | POA: Diagnosis not present

## 2013-10-17 DIAGNOSIS — M26609 Unspecified temporomandibular joint disorder, unspecified side: Secondary | ICD-10-CM | POA: Diagnosis not present

## 2013-10-17 DIAGNOSIS — M5020 Other cervical disc displacement, unspecified cervical region: Secondary | ICD-10-CM | POA: Diagnosis not present

## 2013-10-31 DIAGNOSIS — G44209 Tension-type headache, unspecified, not intractable: Secondary | ICD-10-CM | POA: Diagnosis not present

## 2013-10-31 DIAGNOSIS — M546 Pain in thoracic spine: Secondary | ICD-10-CM | POA: Diagnosis not present

## 2013-10-31 DIAGNOSIS — M999 Biomechanical lesion, unspecified: Secondary | ICD-10-CM | POA: Diagnosis not present

## 2013-10-31 DIAGNOSIS — M5020 Other cervical disc displacement, unspecified cervical region: Secondary | ICD-10-CM | POA: Diagnosis not present

## 2013-10-31 DIAGNOSIS — R51 Headache: Secondary | ICD-10-CM | POA: Diagnosis not present

## 2013-10-31 DIAGNOSIS — M9981 Other biomechanical lesions of cervical region: Secondary | ICD-10-CM | POA: Diagnosis not present

## 2013-10-31 DIAGNOSIS — M26609 Unspecified temporomandibular joint disorder, unspecified side: Secondary | ICD-10-CM | POA: Diagnosis not present

## 2013-10-31 DIAGNOSIS — M25519 Pain in unspecified shoulder: Secondary | ICD-10-CM | POA: Diagnosis not present

## 2013-11-14 DIAGNOSIS — M25519 Pain in unspecified shoulder: Secondary | ICD-10-CM | POA: Diagnosis not present

## 2013-11-14 DIAGNOSIS — G44209 Tension-type headache, unspecified, not intractable: Secondary | ICD-10-CM | POA: Diagnosis not present

## 2013-11-14 DIAGNOSIS — M26609 Unspecified temporomandibular joint disorder, unspecified side: Secondary | ICD-10-CM | POA: Diagnosis not present

## 2013-11-14 DIAGNOSIS — M9981 Other biomechanical lesions of cervical region: Secondary | ICD-10-CM | POA: Diagnosis not present

## 2013-11-14 DIAGNOSIS — M5020 Other cervical disc displacement, unspecified cervical region: Secondary | ICD-10-CM | POA: Diagnosis not present

## 2013-11-14 DIAGNOSIS — R51 Headache: Secondary | ICD-10-CM | POA: Diagnosis not present

## 2013-11-14 DIAGNOSIS — M546 Pain in thoracic spine: Secondary | ICD-10-CM | POA: Diagnosis not present

## 2013-11-14 DIAGNOSIS — M999 Biomechanical lesion, unspecified: Secondary | ICD-10-CM | POA: Diagnosis not present

## 2013-11-18 DIAGNOSIS — M5020 Other cervical disc displacement, unspecified cervical region: Secondary | ICD-10-CM | POA: Diagnosis not present

## 2013-11-18 DIAGNOSIS — M9981 Other biomechanical lesions of cervical region: Secondary | ICD-10-CM | POA: Diagnosis not present

## 2013-11-18 DIAGNOSIS — M999 Biomechanical lesion, unspecified: Secondary | ICD-10-CM | POA: Diagnosis not present

## 2013-11-18 DIAGNOSIS — M546 Pain in thoracic spine: Secondary | ICD-10-CM | POA: Diagnosis not present

## 2013-11-18 DIAGNOSIS — R51 Headache: Secondary | ICD-10-CM | POA: Diagnosis not present

## 2013-11-18 DIAGNOSIS — G44209 Tension-type headache, unspecified, not intractable: Secondary | ICD-10-CM | POA: Diagnosis not present

## 2013-11-18 DIAGNOSIS — M26609 Unspecified temporomandibular joint disorder, unspecified side: Secondary | ICD-10-CM | POA: Diagnosis not present

## 2013-11-18 DIAGNOSIS — M25519 Pain in unspecified shoulder: Secondary | ICD-10-CM | POA: Diagnosis not present

## 2013-11-28 DIAGNOSIS — R51 Headache: Secondary | ICD-10-CM | POA: Diagnosis not present

## 2013-11-28 DIAGNOSIS — M9981 Other biomechanical lesions of cervical region: Secondary | ICD-10-CM | POA: Diagnosis not present

## 2013-11-28 DIAGNOSIS — M25519 Pain in unspecified shoulder: Secondary | ICD-10-CM | POA: Diagnosis not present

## 2013-11-28 DIAGNOSIS — M26609 Unspecified temporomandibular joint disorder, unspecified side: Secondary | ICD-10-CM | POA: Diagnosis not present

## 2013-11-28 DIAGNOSIS — M546 Pain in thoracic spine: Secondary | ICD-10-CM | POA: Diagnosis not present

## 2013-11-28 DIAGNOSIS — M999 Biomechanical lesion, unspecified: Secondary | ICD-10-CM | POA: Diagnosis not present

## 2013-11-28 DIAGNOSIS — M5020 Other cervical disc displacement, unspecified cervical region: Secondary | ICD-10-CM | POA: Diagnosis not present

## 2013-11-28 DIAGNOSIS — G44209 Tension-type headache, unspecified, not intractable: Secondary | ICD-10-CM | POA: Diagnosis not present

## 2013-12-12 DIAGNOSIS — M999 Biomechanical lesion, unspecified: Secondary | ICD-10-CM | POA: Diagnosis not present

## 2013-12-12 DIAGNOSIS — M5020 Other cervical disc displacement, unspecified cervical region: Secondary | ICD-10-CM | POA: Diagnosis not present

## 2013-12-12 DIAGNOSIS — M546 Pain in thoracic spine: Secondary | ICD-10-CM | POA: Diagnosis not present

## 2013-12-12 DIAGNOSIS — M9981 Other biomechanical lesions of cervical region: Secondary | ICD-10-CM | POA: Diagnosis not present

## 2013-12-12 DIAGNOSIS — M26609 Unspecified temporomandibular joint disorder, unspecified side: Secondary | ICD-10-CM | POA: Diagnosis not present

## 2013-12-12 DIAGNOSIS — R51 Headache: Secondary | ICD-10-CM | POA: Diagnosis not present

## 2013-12-12 DIAGNOSIS — M25519 Pain in unspecified shoulder: Secondary | ICD-10-CM | POA: Diagnosis not present

## 2013-12-12 DIAGNOSIS — G44209 Tension-type headache, unspecified, not intractable: Secondary | ICD-10-CM | POA: Diagnosis not present

## 2014-01-23 DIAGNOSIS — M9901 Segmental and somatic dysfunction of cervical region: Secondary | ICD-10-CM | POA: Diagnosis not present

## 2014-01-23 DIAGNOSIS — R51 Headache: Secondary | ICD-10-CM | POA: Diagnosis not present

## 2014-01-23 DIAGNOSIS — M545 Low back pain: Secondary | ICD-10-CM | POA: Diagnosis not present

## 2014-01-23 DIAGNOSIS — M5020 Other cervical disc displacement, unspecified cervical region: Secondary | ICD-10-CM | POA: Diagnosis not present

## 2014-01-23 DIAGNOSIS — M542 Cervicalgia: Secondary | ICD-10-CM | POA: Diagnosis not present

## 2014-01-23 DIAGNOSIS — M9902 Segmental and somatic dysfunction of thoracic region: Secondary | ICD-10-CM | POA: Diagnosis not present

## 2014-01-24 DIAGNOSIS — L89322 Pressure ulcer of left buttock, stage 2: Secondary | ICD-10-CM | POA: Diagnosis not present

## 2014-03-03 DIAGNOSIS — M545 Low back pain: Secondary | ICD-10-CM | POA: Diagnosis not present

## 2014-03-03 DIAGNOSIS — M5020 Other cervical disc displacement, unspecified cervical region: Secondary | ICD-10-CM | POA: Diagnosis not present

## 2014-03-03 DIAGNOSIS — M9902 Segmental and somatic dysfunction of thoracic region: Secondary | ICD-10-CM | POA: Diagnosis not present

## 2014-03-03 DIAGNOSIS — R51 Headache: Secondary | ICD-10-CM | POA: Diagnosis not present

## 2014-03-03 DIAGNOSIS — M9901 Segmental and somatic dysfunction of cervical region: Secondary | ICD-10-CM | POA: Diagnosis not present

## 2014-03-03 DIAGNOSIS — M542 Cervicalgia: Secondary | ICD-10-CM | POA: Diagnosis not present

## 2014-03-13 DIAGNOSIS — M545 Low back pain: Secondary | ICD-10-CM | POA: Diagnosis not present

## 2014-03-13 DIAGNOSIS — M5020 Other cervical disc displacement, unspecified cervical region: Secondary | ICD-10-CM | POA: Diagnosis not present

## 2014-03-13 DIAGNOSIS — M9902 Segmental and somatic dysfunction of thoracic region: Secondary | ICD-10-CM | POA: Diagnosis not present

## 2014-03-13 DIAGNOSIS — M9901 Segmental and somatic dysfunction of cervical region: Secondary | ICD-10-CM | POA: Diagnosis not present

## 2014-03-13 DIAGNOSIS — M542 Cervicalgia: Secondary | ICD-10-CM | POA: Diagnosis not present

## 2014-03-13 DIAGNOSIS — R51 Headache: Secondary | ICD-10-CM | POA: Diagnosis not present

## 2014-03-27 DIAGNOSIS — M545 Low back pain: Secondary | ICD-10-CM | POA: Diagnosis not present

## 2014-03-27 DIAGNOSIS — M542 Cervicalgia: Secondary | ICD-10-CM | POA: Diagnosis not present

## 2014-03-27 DIAGNOSIS — M5020 Other cervical disc displacement, unspecified cervical region: Secondary | ICD-10-CM | POA: Diagnosis not present

## 2014-03-27 DIAGNOSIS — R51 Headache: Secondary | ICD-10-CM | POA: Diagnosis not present

## 2014-03-27 DIAGNOSIS — M9901 Segmental and somatic dysfunction of cervical region: Secondary | ICD-10-CM | POA: Diagnosis not present

## 2014-03-27 DIAGNOSIS — M9902 Segmental and somatic dysfunction of thoracic region: Secondary | ICD-10-CM | POA: Diagnosis not present

## 2014-03-29 DIAGNOSIS — S81851A Open bite, right lower leg, initial encounter: Secondary | ICD-10-CM | POA: Diagnosis not present

## 2014-03-30 ENCOUNTER — Emergency Department: Payer: Self-pay | Admitting: Emergency Medicine

## 2014-03-30 DIAGNOSIS — S81831A Puncture wound without foreign body, right lower leg, initial encounter: Secondary | ICD-10-CM | POA: Diagnosis not present

## 2014-03-30 DIAGNOSIS — S8991XA Unspecified injury of right lower leg, initial encounter: Secondary | ICD-10-CM | POA: Diagnosis not present

## 2014-03-30 DIAGNOSIS — Z91040 Latex allergy status: Secondary | ICD-10-CM | POA: Diagnosis not present

## 2014-03-30 DIAGNOSIS — Z23 Encounter for immunization: Secondary | ICD-10-CM | POA: Diagnosis not present

## 2014-03-30 DIAGNOSIS — S81851A Open bite, right lower leg, initial encounter: Secondary | ICD-10-CM | POA: Diagnosis not present

## 2014-04-02 ENCOUNTER — Emergency Department: Payer: Self-pay | Admitting: Internal Medicine

## 2014-04-02 DIAGNOSIS — Z91040 Latex allergy status: Secondary | ICD-10-CM | POA: Diagnosis not present

## 2014-04-02 DIAGNOSIS — Z23 Encounter for immunization: Secondary | ICD-10-CM | POA: Diagnosis not present

## 2014-04-02 DIAGNOSIS — Z87828 Personal history of other (healed) physical injury and trauma: Secondary | ICD-10-CM | POA: Diagnosis not present

## 2014-04-06 ENCOUNTER — Emergency Department: Payer: Self-pay | Admitting: Emergency Medicine

## 2014-04-06 DIAGNOSIS — Z79899 Other long term (current) drug therapy: Secondary | ICD-10-CM | POA: Diagnosis not present

## 2014-04-06 DIAGNOSIS — Z23 Encounter for immunization: Secondary | ICD-10-CM | POA: Diagnosis not present

## 2014-04-06 DIAGNOSIS — Z87828 Personal history of other (healed) physical injury and trauma: Secondary | ICD-10-CM | POA: Diagnosis not present

## 2014-04-06 DIAGNOSIS — Z91040 Latex allergy status: Secondary | ICD-10-CM | POA: Diagnosis not present

## 2014-04-13 ENCOUNTER — Emergency Department: Payer: Self-pay | Admitting: Emergency Medicine

## 2014-04-13 DIAGNOSIS — Z23 Encounter for immunization: Secondary | ICD-10-CM | POA: Diagnosis not present

## 2014-04-13 DIAGNOSIS — Z91040 Latex allergy status: Secondary | ICD-10-CM | POA: Diagnosis not present

## 2014-04-24 DIAGNOSIS — L89320 Pressure ulcer of left buttock, unstageable: Secondary | ICD-10-CM | POA: Diagnosis not present

## 2014-05-01 DIAGNOSIS — M542 Cervicalgia: Secondary | ICD-10-CM | POA: Diagnosis not present

## 2014-05-01 DIAGNOSIS — M5020 Other cervical disc displacement, unspecified cervical region: Secondary | ICD-10-CM | POA: Diagnosis not present

## 2014-05-01 DIAGNOSIS — M545 Low back pain: Secondary | ICD-10-CM | POA: Diagnosis not present

## 2014-05-01 DIAGNOSIS — R51 Headache: Secondary | ICD-10-CM | POA: Diagnosis not present

## 2014-05-01 DIAGNOSIS — M9901 Segmental and somatic dysfunction of cervical region: Secondary | ICD-10-CM | POA: Diagnosis not present

## 2014-05-01 DIAGNOSIS — M9902 Segmental and somatic dysfunction of thoracic region: Secondary | ICD-10-CM | POA: Diagnosis not present

## 2014-05-10 DIAGNOSIS — M9903 Segmental and somatic dysfunction of lumbar region: Secondary | ICD-10-CM | POA: Diagnosis not present

## 2014-05-10 DIAGNOSIS — M545 Low back pain: Secondary | ICD-10-CM | POA: Diagnosis not present

## 2014-05-10 DIAGNOSIS — R51 Headache: Secondary | ICD-10-CM | POA: Diagnosis not present

## 2014-05-10 DIAGNOSIS — M542 Cervicalgia: Secondary | ICD-10-CM | POA: Diagnosis not present

## 2014-05-10 DIAGNOSIS — M9901 Segmental and somatic dysfunction of cervical region: Secondary | ICD-10-CM | POA: Diagnosis not present

## 2014-05-10 DIAGNOSIS — M7552 Bursitis of left shoulder: Secondary | ICD-10-CM | POA: Diagnosis not present

## 2014-05-10 DIAGNOSIS — M546 Pain in thoracic spine: Secondary | ICD-10-CM | POA: Diagnosis not present

## 2014-05-10 DIAGNOSIS — M9902 Segmental and somatic dysfunction of thoracic region: Secondary | ICD-10-CM | POA: Diagnosis not present

## 2014-05-10 DIAGNOSIS — M5020 Other cervical disc displacement, unspecified cervical region: Secondary | ICD-10-CM | POA: Diagnosis not present

## 2014-05-22 DIAGNOSIS — M5020 Other cervical disc displacement, unspecified cervical region: Secondary | ICD-10-CM | POA: Diagnosis not present

## 2014-05-22 DIAGNOSIS — M9901 Segmental and somatic dysfunction of cervical region: Secondary | ICD-10-CM | POA: Diagnosis not present

## 2014-05-22 DIAGNOSIS — M9903 Segmental and somatic dysfunction of lumbar region: Secondary | ICD-10-CM | POA: Diagnosis not present

## 2014-05-22 DIAGNOSIS — M9902 Segmental and somatic dysfunction of thoracic region: Secondary | ICD-10-CM | POA: Diagnosis not present

## 2014-05-22 DIAGNOSIS — R51 Headache: Secondary | ICD-10-CM | POA: Diagnosis not present

## 2014-05-22 DIAGNOSIS — M546 Pain in thoracic spine: Secondary | ICD-10-CM | POA: Diagnosis not present

## 2014-05-22 DIAGNOSIS — M542 Cervicalgia: Secondary | ICD-10-CM | POA: Diagnosis not present

## 2014-05-22 DIAGNOSIS — M545 Low back pain: Secondary | ICD-10-CM | POA: Diagnosis not present

## 2014-05-29 DIAGNOSIS — M5020 Other cervical disc displacement, unspecified cervical region: Secondary | ICD-10-CM | POA: Diagnosis not present

## 2014-05-29 DIAGNOSIS — M542 Cervicalgia: Secondary | ICD-10-CM | POA: Diagnosis not present

## 2014-05-29 DIAGNOSIS — M546 Pain in thoracic spine: Secondary | ICD-10-CM | POA: Diagnosis not present

## 2014-05-29 DIAGNOSIS — M9901 Segmental and somatic dysfunction of cervical region: Secondary | ICD-10-CM | POA: Diagnosis not present

## 2014-05-29 DIAGNOSIS — M9903 Segmental and somatic dysfunction of lumbar region: Secondary | ICD-10-CM | POA: Diagnosis not present

## 2014-05-29 DIAGNOSIS — R51 Headache: Secondary | ICD-10-CM | POA: Diagnosis not present

## 2014-05-29 DIAGNOSIS — M545 Low back pain: Secondary | ICD-10-CM | POA: Diagnosis not present

## 2014-05-29 DIAGNOSIS — M9902 Segmental and somatic dysfunction of thoracic region: Secondary | ICD-10-CM | POA: Diagnosis not present

## 2014-05-31 DIAGNOSIS — T83191A Other mechanical complication of urinary sphincter implant, initial encounter: Secondary | ICD-10-CM | POA: Diagnosis not present

## 2014-05-31 DIAGNOSIS — N319 Neuromuscular dysfunction of bladder, unspecified: Secondary | ICD-10-CM | POA: Diagnosis not present

## 2014-05-31 DIAGNOSIS — R32 Unspecified urinary incontinence: Secondary | ICD-10-CM | POA: Diagnosis not present

## 2014-05-31 DIAGNOSIS — N39498 Other specified urinary incontinence: Secondary | ICD-10-CM | POA: Diagnosis not present

## 2014-06-02 DIAGNOSIS — R51 Headache: Secondary | ICD-10-CM | POA: Diagnosis not present

## 2014-06-02 DIAGNOSIS — M5020 Other cervical disc displacement, unspecified cervical region: Secondary | ICD-10-CM | POA: Diagnosis not present

## 2014-06-02 DIAGNOSIS — M545 Low back pain: Secondary | ICD-10-CM | POA: Diagnosis not present

## 2014-06-02 DIAGNOSIS — M542 Cervicalgia: Secondary | ICD-10-CM | POA: Diagnosis not present

## 2014-06-02 DIAGNOSIS — M546 Pain in thoracic spine: Secondary | ICD-10-CM | POA: Diagnosis not present

## 2014-06-02 DIAGNOSIS — M9903 Segmental and somatic dysfunction of lumbar region: Secondary | ICD-10-CM | POA: Diagnosis not present

## 2014-06-02 DIAGNOSIS — M9901 Segmental and somatic dysfunction of cervical region: Secondary | ICD-10-CM | POA: Diagnosis not present

## 2014-06-02 DIAGNOSIS — M9902 Segmental and somatic dysfunction of thoracic region: Secondary | ICD-10-CM | POA: Diagnosis not present

## 2014-06-05 DIAGNOSIS — M9902 Segmental and somatic dysfunction of thoracic region: Secondary | ICD-10-CM | POA: Diagnosis not present

## 2014-06-05 DIAGNOSIS — M546 Pain in thoracic spine: Secondary | ICD-10-CM | POA: Diagnosis not present

## 2014-06-05 DIAGNOSIS — R51 Headache: Secondary | ICD-10-CM | POA: Diagnosis not present

## 2014-06-05 DIAGNOSIS — M542 Cervicalgia: Secondary | ICD-10-CM | POA: Diagnosis not present

## 2014-06-05 DIAGNOSIS — M9903 Segmental and somatic dysfunction of lumbar region: Secondary | ICD-10-CM | POA: Diagnosis not present

## 2014-06-05 DIAGNOSIS — M545 Low back pain: Secondary | ICD-10-CM | POA: Diagnosis not present

## 2014-06-05 DIAGNOSIS — M5020 Other cervical disc displacement, unspecified cervical region: Secondary | ICD-10-CM | POA: Diagnosis not present

## 2014-06-05 DIAGNOSIS — M9901 Segmental and somatic dysfunction of cervical region: Secondary | ICD-10-CM | POA: Diagnosis not present

## 2014-06-09 DIAGNOSIS — M9903 Segmental and somatic dysfunction of lumbar region: Secondary | ICD-10-CM | POA: Diagnosis not present

## 2014-06-09 DIAGNOSIS — M9901 Segmental and somatic dysfunction of cervical region: Secondary | ICD-10-CM | POA: Diagnosis not present

## 2014-06-09 DIAGNOSIS — M9902 Segmental and somatic dysfunction of thoracic region: Secondary | ICD-10-CM | POA: Diagnosis not present

## 2014-06-09 DIAGNOSIS — R51 Headache: Secondary | ICD-10-CM | POA: Diagnosis not present

## 2014-06-09 DIAGNOSIS — M545 Low back pain: Secondary | ICD-10-CM | POA: Diagnosis not present

## 2014-06-09 DIAGNOSIS — M546 Pain in thoracic spine: Secondary | ICD-10-CM | POA: Diagnosis not present

## 2014-06-09 DIAGNOSIS — M5020 Other cervical disc displacement, unspecified cervical region: Secondary | ICD-10-CM | POA: Diagnosis not present

## 2014-06-09 DIAGNOSIS — M542 Cervicalgia: Secondary | ICD-10-CM | POA: Diagnosis not present

## 2014-06-12 DIAGNOSIS — M9903 Segmental and somatic dysfunction of lumbar region: Secondary | ICD-10-CM | POA: Diagnosis not present

## 2014-06-12 DIAGNOSIS — M545 Low back pain: Secondary | ICD-10-CM | POA: Diagnosis not present

## 2014-06-12 DIAGNOSIS — M9901 Segmental and somatic dysfunction of cervical region: Secondary | ICD-10-CM | POA: Diagnosis not present

## 2014-06-12 DIAGNOSIS — M542 Cervicalgia: Secondary | ICD-10-CM | POA: Diagnosis not present

## 2014-06-12 DIAGNOSIS — M546 Pain in thoracic spine: Secondary | ICD-10-CM | POA: Diagnosis not present

## 2014-06-12 DIAGNOSIS — M5020 Other cervical disc displacement, unspecified cervical region: Secondary | ICD-10-CM | POA: Diagnosis not present

## 2014-06-12 DIAGNOSIS — R51 Headache: Secondary | ICD-10-CM | POA: Diagnosis not present

## 2014-06-12 DIAGNOSIS — M9902 Segmental and somatic dysfunction of thoracic region: Secondary | ICD-10-CM | POA: Diagnosis not present

## 2014-06-14 DIAGNOSIS — M9903 Segmental and somatic dysfunction of lumbar region: Secondary | ICD-10-CM | POA: Diagnosis not present

## 2014-06-14 DIAGNOSIS — R51 Headache: Secondary | ICD-10-CM | POA: Diagnosis not present

## 2014-06-14 DIAGNOSIS — M542 Cervicalgia: Secondary | ICD-10-CM | POA: Diagnosis not present

## 2014-06-14 DIAGNOSIS — M545 Low back pain: Secondary | ICD-10-CM | POA: Diagnosis not present

## 2014-06-14 DIAGNOSIS — M5020 Other cervical disc displacement, unspecified cervical region: Secondary | ICD-10-CM | POA: Diagnosis not present

## 2014-06-14 DIAGNOSIS — M9901 Segmental and somatic dysfunction of cervical region: Secondary | ICD-10-CM | POA: Diagnosis not present

## 2014-06-14 DIAGNOSIS — M9902 Segmental and somatic dysfunction of thoracic region: Secondary | ICD-10-CM | POA: Diagnosis not present

## 2014-06-14 DIAGNOSIS — M546 Pain in thoracic spine: Secondary | ICD-10-CM | POA: Diagnosis not present

## 2014-06-16 DIAGNOSIS — M9901 Segmental and somatic dysfunction of cervical region: Secondary | ICD-10-CM | POA: Diagnosis not present

## 2014-06-16 DIAGNOSIS — M5020 Other cervical disc displacement, unspecified cervical region: Secondary | ICD-10-CM | POA: Diagnosis not present

## 2014-06-16 DIAGNOSIS — M9902 Segmental and somatic dysfunction of thoracic region: Secondary | ICD-10-CM | POA: Diagnosis not present

## 2014-06-16 DIAGNOSIS — M545 Low back pain: Secondary | ICD-10-CM | POA: Diagnosis not present

## 2014-06-16 DIAGNOSIS — M9903 Segmental and somatic dysfunction of lumbar region: Secondary | ICD-10-CM | POA: Diagnosis not present

## 2014-06-16 DIAGNOSIS — M546 Pain in thoracic spine: Secondary | ICD-10-CM | POA: Diagnosis not present

## 2014-06-16 DIAGNOSIS — M542 Cervicalgia: Secondary | ICD-10-CM | POA: Diagnosis not present

## 2014-06-16 DIAGNOSIS — R51 Headache: Secondary | ICD-10-CM | POA: Diagnosis not present

## 2014-06-19 DIAGNOSIS — M9901 Segmental and somatic dysfunction of cervical region: Secondary | ICD-10-CM | POA: Diagnosis not present

## 2014-06-19 DIAGNOSIS — M545 Low back pain: Secondary | ICD-10-CM | POA: Diagnosis not present

## 2014-06-19 DIAGNOSIS — R51 Headache: Secondary | ICD-10-CM | POA: Diagnosis not present

## 2014-06-19 DIAGNOSIS — M9903 Segmental and somatic dysfunction of lumbar region: Secondary | ICD-10-CM | POA: Diagnosis not present

## 2014-06-19 DIAGNOSIS — M542 Cervicalgia: Secondary | ICD-10-CM | POA: Diagnosis not present

## 2014-06-19 DIAGNOSIS — M5020 Other cervical disc displacement, unspecified cervical region: Secondary | ICD-10-CM | POA: Diagnosis not present

## 2014-06-19 DIAGNOSIS — M546 Pain in thoracic spine: Secondary | ICD-10-CM | POA: Diagnosis not present

## 2014-06-19 DIAGNOSIS — M9902 Segmental and somatic dysfunction of thoracic region: Secondary | ICD-10-CM | POA: Diagnosis not present

## 2014-06-21 DIAGNOSIS — M5020 Other cervical disc displacement, unspecified cervical region: Secondary | ICD-10-CM | POA: Diagnosis not present

## 2014-06-21 DIAGNOSIS — M9903 Segmental and somatic dysfunction of lumbar region: Secondary | ICD-10-CM | POA: Diagnosis not present

## 2014-06-21 DIAGNOSIS — M542 Cervicalgia: Secondary | ICD-10-CM | POA: Diagnosis not present

## 2014-06-21 DIAGNOSIS — R51 Headache: Secondary | ICD-10-CM | POA: Diagnosis not present

## 2014-06-21 DIAGNOSIS — M9902 Segmental and somatic dysfunction of thoracic region: Secondary | ICD-10-CM | POA: Diagnosis not present

## 2014-06-21 DIAGNOSIS — M546 Pain in thoracic spine: Secondary | ICD-10-CM | POA: Diagnosis not present

## 2014-06-21 DIAGNOSIS — M9901 Segmental and somatic dysfunction of cervical region: Secondary | ICD-10-CM | POA: Diagnosis not present

## 2014-06-21 DIAGNOSIS — M545 Low back pain: Secondary | ICD-10-CM | POA: Diagnosis not present

## 2014-06-23 DIAGNOSIS — M9903 Segmental and somatic dysfunction of lumbar region: Secondary | ICD-10-CM | POA: Diagnosis not present

## 2014-06-23 DIAGNOSIS — M542 Cervicalgia: Secondary | ICD-10-CM | POA: Diagnosis not present

## 2014-06-23 DIAGNOSIS — M9901 Segmental and somatic dysfunction of cervical region: Secondary | ICD-10-CM | POA: Diagnosis not present

## 2014-06-23 DIAGNOSIS — R51 Headache: Secondary | ICD-10-CM | POA: Diagnosis not present

## 2014-06-23 DIAGNOSIS — M545 Low back pain: Secondary | ICD-10-CM | POA: Diagnosis not present

## 2014-06-23 DIAGNOSIS — M546 Pain in thoracic spine: Secondary | ICD-10-CM | POA: Diagnosis not present

## 2014-06-23 DIAGNOSIS — M5020 Other cervical disc displacement, unspecified cervical region: Secondary | ICD-10-CM | POA: Diagnosis not present

## 2014-06-23 DIAGNOSIS — M9902 Segmental and somatic dysfunction of thoracic region: Secondary | ICD-10-CM | POA: Diagnosis not present

## 2014-06-26 DIAGNOSIS — R51 Headache: Secondary | ICD-10-CM | POA: Diagnosis not present

## 2014-06-26 DIAGNOSIS — M5020 Other cervical disc displacement, unspecified cervical region: Secondary | ICD-10-CM | POA: Diagnosis not present

## 2014-06-26 DIAGNOSIS — M9901 Segmental and somatic dysfunction of cervical region: Secondary | ICD-10-CM | POA: Diagnosis not present

## 2014-06-26 DIAGNOSIS — M545 Low back pain: Secondary | ICD-10-CM | POA: Diagnosis not present

## 2014-06-26 DIAGNOSIS — M9903 Segmental and somatic dysfunction of lumbar region: Secondary | ICD-10-CM | POA: Diagnosis not present

## 2014-06-26 DIAGNOSIS — M542 Cervicalgia: Secondary | ICD-10-CM | POA: Diagnosis not present

## 2014-06-26 DIAGNOSIS — M9902 Segmental and somatic dysfunction of thoracic region: Secondary | ICD-10-CM | POA: Diagnosis not present

## 2014-06-26 DIAGNOSIS — M546 Pain in thoracic spine: Secondary | ICD-10-CM | POA: Diagnosis not present

## 2014-06-28 DIAGNOSIS — M9903 Segmental and somatic dysfunction of lumbar region: Secondary | ICD-10-CM | POA: Diagnosis not present

## 2014-06-28 DIAGNOSIS — M9902 Segmental and somatic dysfunction of thoracic region: Secondary | ICD-10-CM | POA: Diagnosis not present

## 2014-06-28 DIAGNOSIS — M9901 Segmental and somatic dysfunction of cervical region: Secondary | ICD-10-CM | POA: Diagnosis not present

## 2014-06-28 DIAGNOSIS — R51 Headache: Secondary | ICD-10-CM | POA: Diagnosis not present

## 2014-06-28 DIAGNOSIS — M542 Cervicalgia: Secondary | ICD-10-CM | POA: Diagnosis not present

## 2014-06-28 DIAGNOSIS — M545 Low back pain: Secondary | ICD-10-CM | POA: Diagnosis not present

## 2014-06-28 DIAGNOSIS — M546 Pain in thoracic spine: Secondary | ICD-10-CM | POA: Diagnosis not present

## 2014-06-28 DIAGNOSIS — M5020 Other cervical disc displacement, unspecified cervical region: Secondary | ICD-10-CM | POA: Diagnosis not present

## 2014-06-30 DIAGNOSIS — R51 Headache: Secondary | ICD-10-CM | POA: Diagnosis not present

## 2014-06-30 DIAGNOSIS — M545 Low back pain: Secondary | ICD-10-CM | POA: Diagnosis not present

## 2014-06-30 DIAGNOSIS — M9901 Segmental and somatic dysfunction of cervical region: Secondary | ICD-10-CM | POA: Diagnosis not present

## 2014-06-30 DIAGNOSIS — M9902 Segmental and somatic dysfunction of thoracic region: Secondary | ICD-10-CM | POA: Diagnosis not present

## 2014-06-30 DIAGNOSIS — M5020 Other cervical disc displacement, unspecified cervical region: Secondary | ICD-10-CM | POA: Diagnosis not present

## 2014-06-30 DIAGNOSIS — M542 Cervicalgia: Secondary | ICD-10-CM | POA: Diagnosis not present

## 2014-06-30 DIAGNOSIS — M546 Pain in thoracic spine: Secondary | ICD-10-CM | POA: Diagnosis not present

## 2014-06-30 DIAGNOSIS — M9903 Segmental and somatic dysfunction of lumbar region: Secondary | ICD-10-CM | POA: Diagnosis not present

## 2014-07-03 DIAGNOSIS — M5020 Other cervical disc displacement, unspecified cervical region: Secondary | ICD-10-CM | POA: Diagnosis not present

## 2014-07-03 DIAGNOSIS — R51 Headache: Secondary | ICD-10-CM | POA: Diagnosis not present

## 2014-07-03 DIAGNOSIS — N3001 Acute cystitis with hematuria: Secondary | ICD-10-CM | POA: Diagnosis not present

## 2014-07-03 DIAGNOSIS — M546 Pain in thoracic spine: Secondary | ICD-10-CM | POA: Diagnosis not present

## 2014-07-03 DIAGNOSIS — N9089 Other specified noninflammatory disorders of vulva and perineum: Secondary | ICD-10-CM | POA: Diagnosis not present

## 2014-07-03 DIAGNOSIS — M9903 Segmental and somatic dysfunction of lumbar region: Secondary | ICD-10-CM | POA: Diagnosis not present

## 2014-07-03 DIAGNOSIS — M545 Low back pain: Secondary | ICD-10-CM | POA: Diagnosis not present

## 2014-07-03 DIAGNOSIS — M542 Cervicalgia: Secondary | ICD-10-CM | POA: Diagnosis not present

## 2014-07-03 DIAGNOSIS — M9902 Segmental and somatic dysfunction of thoracic region: Secondary | ICD-10-CM | POA: Diagnosis not present

## 2014-07-03 DIAGNOSIS — M9901 Segmental and somatic dysfunction of cervical region: Secondary | ICD-10-CM | POA: Diagnosis not present

## 2014-07-07 DIAGNOSIS — R51 Headache: Secondary | ICD-10-CM | POA: Diagnosis not present

## 2014-07-07 DIAGNOSIS — M9901 Segmental and somatic dysfunction of cervical region: Secondary | ICD-10-CM | POA: Diagnosis not present

## 2014-07-07 DIAGNOSIS — M9902 Segmental and somatic dysfunction of thoracic region: Secondary | ICD-10-CM | POA: Diagnosis not present

## 2014-07-07 DIAGNOSIS — M545 Low back pain: Secondary | ICD-10-CM | POA: Diagnosis not present

## 2014-07-07 DIAGNOSIS — M5020 Other cervical disc displacement, unspecified cervical region: Secondary | ICD-10-CM | POA: Diagnosis not present

## 2014-07-07 DIAGNOSIS — M546 Pain in thoracic spine: Secondary | ICD-10-CM | POA: Diagnosis not present

## 2014-07-07 DIAGNOSIS — M9903 Segmental and somatic dysfunction of lumbar region: Secondary | ICD-10-CM | POA: Diagnosis not present

## 2014-07-07 DIAGNOSIS — M542 Cervicalgia: Secondary | ICD-10-CM | POA: Diagnosis not present

## 2014-07-12 DIAGNOSIS — M5020 Other cervical disc displacement, unspecified cervical region: Secondary | ICD-10-CM | POA: Diagnosis not present

## 2014-07-12 DIAGNOSIS — M9901 Segmental and somatic dysfunction of cervical region: Secondary | ICD-10-CM | POA: Diagnosis not present

## 2014-07-12 DIAGNOSIS — M542 Cervicalgia: Secondary | ICD-10-CM | POA: Diagnosis not present

## 2014-07-12 DIAGNOSIS — M9902 Segmental and somatic dysfunction of thoracic region: Secondary | ICD-10-CM | POA: Diagnosis not present

## 2014-07-12 DIAGNOSIS — R51 Headache: Secondary | ICD-10-CM | POA: Diagnosis not present

## 2014-07-12 DIAGNOSIS — M545 Low back pain: Secondary | ICD-10-CM | POA: Diagnosis not present

## 2014-07-12 DIAGNOSIS — M546 Pain in thoracic spine: Secondary | ICD-10-CM | POA: Diagnosis not present

## 2014-07-12 DIAGNOSIS — M9903 Segmental and somatic dysfunction of lumbar region: Secondary | ICD-10-CM | POA: Diagnosis not present

## 2014-07-17 DIAGNOSIS — M9901 Segmental and somatic dysfunction of cervical region: Secondary | ICD-10-CM | POA: Diagnosis not present

## 2014-07-17 DIAGNOSIS — Z01419 Encounter for gynecological examination (general) (routine) without abnormal findings: Secondary | ICD-10-CM | POA: Diagnosis not present

## 2014-07-17 DIAGNOSIS — M546 Pain in thoracic spine: Secondary | ICD-10-CM | POA: Diagnosis not present

## 2014-07-17 DIAGNOSIS — Z803 Family history of malignant neoplasm of breast: Secondary | ICD-10-CM | POA: Diagnosis not present

## 2014-07-17 DIAGNOSIS — M9902 Segmental and somatic dysfunction of thoracic region: Secondary | ICD-10-CM | POA: Diagnosis not present

## 2014-07-17 DIAGNOSIS — N9089 Other specified noninflammatory disorders of vulva and perineum: Secondary | ICD-10-CM | POA: Diagnosis not present

## 2014-07-17 DIAGNOSIS — M542 Cervicalgia: Secondary | ICD-10-CM | POA: Diagnosis not present

## 2014-07-17 DIAGNOSIS — M545 Low back pain: Secondary | ICD-10-CM | POA: Diagnosis not present

## 2014-07-17 DIAGNOSIS — M5020 Other cervical disc displacement, unspecified cervical region: Secondary | ICD-10-CM | POA: Diagnosis not present

## 2014-07-17 DIAGNOSIS — M9903 Segmental and somatic dysfunction of lumbar region: Secondary | ICD-10-CM | POA: Diagnosis not present

## 2014-07-17 DIAGNOSIS — B9689 Other specified bacterial agents as the cause of diseases classified elsewhere: Secondary | ICD-10-CM | POA: Diagnosis not present

## 2014-07-17 DIAGNOSIS — Z1231 Encounter for screening mammogram for malignant neoplasm of breast: Secondary | ICD-10-CM | POA: Diagnosis not present

## 2014-07-17 DIAGNOSIS — R51 Headache: Secondary | ICD-10-CM | POA: Diagnosis not present

## 2014-07-24 DIAGNOSIS — L89320 Pressure ulcer of left buttock, unstageable: Secondary | ICD-10-CM | POA: Diagnosis not present

## 2014-07-31 DIAGNOSIS — M9903 Segmental and somatic dysfunction of lumbar region: Secondary | ICD-10-CM | POA: Diagnosis not present

## 2014-07-31 DIAGNOSIS — M9901 Segmental and somatic dysfunction of cervical region: Secondary | ICD-10-CM | POA: Diagnosis not present

## 2014-07-31 DIAGNOSIS — R51 Headache: Secondary | ICD-10-CM | POA: Diagnosis not present

## 2014-07-31 DIAGNOSIS — M9902 Segmental and somatic dysfunction of thoracic region: Secondary | ICD-10-CM | POA: Diagnosis not present

## 2014-07-31 DIAGNOSIS — M542 Cervicalgia: Secondary | ICD-10-CM | POA: Diagnosis not present

## 2014-07-31 DIAGNOSIS — M545 Low back pain: Secondary | ICD-10-CM | POA: Diagnosis not present

## 2014-07-31 DIAGNOSIS — M5020 Other cervical disc displacement, unspecified cervical region: Secondary | ICD-10-CM | POA: Diagnosis not present

## 2014-07-31 DIAGNOSIS — M546 Pain in thoracic spine: Secondary | ICD-10-CM | POA: Diagnosis not present

## 2014-08-14 DIAGNOSIS — M542 Cervicalgia: Secondary | ICD-10-CM | POA: Diagnosis not present

## 2014-08-14 DIAGNOSIS — R51 Headache: Secondary | ICD-10-CM | POA: Diagnosis not present

## 2014-08-14 DIAGNOSIS — M9903 Segmental and somatic dysfunction of lumbar region: Secondary | ICD-10-CM | POA: Diagnosis not present

## 2014-08-14 DIAGNOSIS — M545 Low back pain: Secondary | ICD-10-CM | POA: Diagnosis not present

## 2014-08-14 DIAGNOSIS — M5020 Other cervical disc displacement, unspecified cervical region: Secondary | ICD-10-CM | POA: Diagnosis not present

## 2014-08-14 DIAGNOSIS — M9901 Segmental and somatic dysfunction of cervical region: Secondary | ICD-10-CM | POA: Diagnosis not present

## 2014-08-14 DIAGNOSIS — M9902 Segmental and somatic dysfunction of thoracic region: Secondary | ICD-10-CM | POA: Diagnosis not present

## 2014-08-14 DIAGNOSIS — M546 Pain in thoracic spine: Secondary | ICD-10-CM | POA: Diagnosis not present

## 2014-08-28 DIAGNOSIS — M9903 Segmental and somatic dysfunction of lumbar region: Secondary | ICD-10-CM | POA: Diagnosis not present

## 2014-08-28 DIAGNOSIS — N390 Urinary tract infection, site not specified: Secondary | ICD-10-CM | POA: Diagnosis not present

## 2014-08-28 DIAGNOSIS — M546 Pain in thoracic spine: Secondary | ICD-10-CM | POA: Diagnosis not present

## 2014-08-28 DIAGNOSIS — M9901 Segmental and somatic dysfunction of cervical region: Secondary | ICD-10-CM | POA: Diagnosis not present

## 2014-08-28 DIAGNOSIS — M542 Cervicalgia: Secondary | ICD-10-CM | POA: Diagnosis not present

## 2014-08-28 DIAGNOSIS — M9902 Segmental and somatic dysfunction of thoracic region: Secondary | ICD-10-CM | POA: Diagnosis not present

## 2014-08-28 DIAGNOSIS — M545 Low back pain: Secondary | ICD-10-CM | POA: Diagnosis not present

## 2014-08-28 DIAGNOSIS — N9089 Other specified noninflammatory disorders of vulva and perineum: Secondary | ICD-10-CM | POA: Diagnosis not present

## 2014-08-28 DIAGNOSIS — R51 Headache: Secondary | ICD-10-CM | POA: Diagnosis not present

## 2014-08-28 DIAGNOSIS — M5020 Other cervical disc displacement, unspecified cervical region: Secondary | ICD-10-CM | POA: Diagnosis not present

## 2014-09-28 ENCOUNTER — Ambulatory Visit (INDEPENDENT_AMBULATORY_CARE_PROVIDER_SITE_OTHER): Payer: Medicare Other | Admitting: Family Medicine

## 2014-09-28 ENCOUNTER — Encounter: Payer: Self-pay | Admitting: Family Medicine

## 2014-09-28 DIAGNOSIS — Z23 Encounter for immunization: Secondary | ICD-10-CM | POA: Diagnosis not present

## 2014-09-28 DIAGNOSIS — Z1389 Encounter for screening for other disorder: Secondary | ICD-10-CM

## 2014-09-28 DIAGNOSIS — N393 Stress incontinence (female) (male): Secondary | ICD-10-CM | POA: Diagnosis not present

## 2014-09-28 DIAGNOSIS — Z111 Encounter for screening for respiratory tuberculosis: Secondary | ICD-10-CM

## 2014-09-28 DIAGNOSIS — Z0289 Encounter for other administrative examinations: Secondary | ICD-10-CM

## 2014-09-28 DIAGNOSIS — N390 Urinary tract infection, site not specified: Secondary | ICD-10-CM | POA: Diagnosis not present

## 2014-09-28 NOTE — Patient Instructions (Signed)
Form completed ; we will call you with test results and give you documentation.

## 2014-09-28 NOTE — Progress Notes (Signed)
Patient ID: Susan Howard, female   DOB: [birth year], 44 y.o.   MRN: [id number] No chief complaint on file. 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

## 2014-10-02 ENCOUNTER — Telehealth: Payer: Self-pay

## 2014-10-02 LAB — QUANTIFERON IN TUBE
QFT TB AG MINUS NIL VALUE: 0 IU/mL
QUANTIFERON MITOGEN VALUE: 9.79 [IU]/mL
QUANTIFERON TB AG VALUE: 0.05 IU/mL
QUANTIFERON TB GOLD: NEGATIVE
Quantiferon Nil Value: 0.05 IU/mL

## 2014-10-02 LAB — QUANTIFERON TB GOLD ASSAY (BLOOD)

## 2014-10-02 NOTE — Telephone Encounter (Signed)
Pt returned call. She is in class today and would have a hard time answering her call. I put pt on hold to see if her results were ready up front because pt would like to pick up her results. They are not up front and when I came back pt had hung up. Thanks TNP

## 2014-10-02 NOTE — Telephone Encounter (Signed)
Left message to call back  

## 2014-10-02 NOTE — Telephone Encounter (Signed)
-----   Message from Carmon Ginsberg, [place] sent at 10/02/2014  7:58 AM EDT ----- Negative TB test. I have printed results for pickup.

## 2014-10-02 NOTE — Telephone Encounter (Signed)
Left message on patient cell phone informing her that report is available for pick up at the front desk.

## 2014-10-10 ENCOUNTER — Other Ambulatory Visit: Payer: Self-pay | Admitting: Family Medicine

## 2014-10-10 ENCOUNTER — Telehealth: Payer: Self-pay | Admitting: Family Medicine

## 2014-10-10 DIAGNOSIS — Z0184 Encounter for antibody response examination: Secondary | ICD-10-CM

## 2014-10-10 NOTE — Telephone Encounter (Signed)
Let me know when lab was sent so i may notify patient

## 2014-10-10 NOTE — Telephone Encounter (Signed)
Pt stated that she needs a Hep B titer for school and would like to pick up a lab slip. Thanks TNP

## 2014-10-10 NOTE — Telephone Encounter (Signed)
Patient advised that lab order has been sent.

## 2014-10-10 NOTE — Telephone Encounter (Signed)
Lab order completed

## 2014-10-11 ENCOUNTER — Other Ambulatory Visit: Payer: Self-pay | Admitting: Family Medicine

## 2014-10-11 DIAGNOSIS — L89320 Pressure ulcer of left buttock, unstageable: Secondary | ICD-10-CM | POA: Diagnosis not present

## 2014-10-11 DIAGNOSIS — Q057 Lumbar spina bifida without hydrocephalus: Secondary | ICD-10-CM | POA: Diagnosis not present

## 2014-10-11 DIAGNOSIS — R32 Unspecified urinary incontinence: Secondary | ICD-10-CM | POA: Diagnosis not present

## 2014-10-11 DIAGNOSIS — Z8614 Personal history of Methicillin resistant Staphylococcus aureus infection: Secondary | ICD-10-CM | POA: Diagnosis not present

## 2014-10-11 DIAGNOSIS — Z0184 Encounter for antibody response examination: Secondary | ICD-10-CM | POA: Diagnosis not present

## 2014-10-11 DIAGNOSIS — Z91040 Latex allergy status: Secondary | ICD-10-CM | POA: Diagnosis not present

## 2014-10-12 ENCOUNTER — Telehealth: Payer: Self-pay

## 2014-10-12 LAB — HEPATITIS B SURFACE ANTIBODY,QUALITATIVE: Hep B Surface Ab, Qual: REACTIVE

## 2014-10-12 NOTE — Telephone Encounter (Signed)
-----   Message from Carmon Ginsberg, [place] sent at 10/12/2014  7:45 AM EDT ----- Hep B antibody consistent with immunity. Patient may have copy of results

## 2014-10-12 NOTE — Telephone Encounter (Signed)
Informed pt of results. Placed copy up front for pick up. Renaldo Fiddler, CMA

## 2014-10-16 DIAGNOSIS — N390 Urinary tract infection, site not specified: Secondary | ICD-10-CM | POA: Diagnosis not present

## 2014-10-16 DIAGNOSIS — N393 Stress incontinence (female) (male): Secondary | ICD-10-CM | POA: Diagnosis not present

## 2014-10-19 DIAGNOSIS — T83191A Other mechanical complication of urinary sphincter implant, initial encounter: Secondary | ICD-10-CM | POA: Diagnosis not present

## 2014-10-19 DIAGNOSIS — Z883 Allergy status to other anti-infective agents status: Secondary | ICD-10-CM | POA: Diagnosis not present

## 2014-10-19 DIAGNOSIS — N393 Stress incontinence (female) (male): Secondary | ICD-10-CM | POA: Diagnosis not present

## 2014-10-22 DIAGNOSIS — R338 Other retention of urine: Secondary | ICD-10-CM | POA: Diagnosis not present

## 2014-10-22 DIAGNOSIS — Q059 Spina bifida, unspecified: Secondary | ICD-10-CM | POA: Diagnosis not present

## 2014-10-24 DIAGNOSIS — Z9889 Other specified postprocedural states: Secondary | ICD-10-CM | POA: Diagnosis not present

## 2014-10-24 DIAGNOSIS — R339 Retention of urine, unspecified: Secondary | ICD-10-CM | POA: Diagnosis not present

## 2014-10-24 DIAGNOSIS — Q057 Lumbar spina bifida without hydrocephalus: Secondary | ICD-10-CM | POA: Diagnosis not present

## 2014-11-09 DIAGNOSIS — C44319 Basal cell carcinoma of skin of other parts of face: Secondary | ICD-10-CM | POA: Diagnosis not present

## 2014-11-09 DIAGNOSIS — D485 Neoplasm of uncertain behavior of skin: Secondary | ICD-10-CM | POA: Diagnosis not present

## 2014-11-20 DIAGNOSIS — N3945 Continuous leakage: Secondary | ICD-10-CM | POA: Diagnosis not present

## 2014-11-20 DIAGNOSIS — N319 Neuromuscular dysfunction of bladder, unspecified: Secondary | ICD-10-CM | POA: Diagnosis not present

## 2014-12-06 ENCOUNTER — Other Ambulatory Visit: Payer: Self-pay | Admitting: Family Medicine

## 2014-12-06 ENCOUNTER — Ambulatory Visit (INDEPENDENT_AMBULATORY_CARE_PROVIDER_SITE_OTHER): Payer: Medicare Other | Admitting: Family Medicine

## 2014-12-06 ENCOUNTER — Encounter: Payer: Self-pay | Admitting: Family Medicine

## 2014-12-06 VITALS — BP 130/92 | HR 52 | Temp 98.4°F | Resp 16 | Wt 107.0 lb

## 2014-12-06 DIAGNOSIS — Z8614 Personal history of Methicillin resistant Staphylococcus aureus infection: Secondary | ICD-10-CM | POA: Insufficient documentation

## 2014-12-06 DIAGNOSIS — Q059 Spina bifida, unspecified: Secondary | ICD-10-CM | POA: Insufficient documentation

## 2014-12-06 DIAGNOSIS — N3001 Acute cystitis with hematuria: Secondary | ICD-10-CM

## 2014-12-06 LAB — POCT URINALYSIS DIPSTICK
Bilirubin (Urine): NEGATIVE
Glucose, UA: NEGATIVE
KETONES UA: NEGATIVE
Nitrite, UA: POSITIVE
PROTEIN UA: NEGATIVE
Spec Grav, UA: 1.01
Urobilinogen, UA: 0.2
pH, UA: 5

## 2014-12-06 NOTE — Patient Instructions (Signed)
We will call with urine culture results. ?

## 2014-12-06 NOTE — Progress Notes (Signed)
Subjective:     Patient ID: Ricci Barker, female   DOB: [birth year], 44 y.o.   MRN: [id number]  HPI  Chief Complaint  Patient presents with  . Urinary Frequency    Patient comes in office today with concerns of urinary frequency, nausea and lower back pain for the past 2 days.   States she had her bladder pump and reservoir replaced this summer. She feels she may not have pumped enough and left too large a residual which may have resulted in her current symptoms. Accompanied by her daughter. Last urine culture in may with non MRSA staph sensitive to nitrofurantoin.   Review of Systems  Constitutional: Positive for fever (low grade at 99 degrees).       Objective:   Physical Exam  Constitutional: She appears well-developed and well-nourished. No distress.  Genitourinary:  No cva tenderness       Assessment:    1. Acute cystitis with hematuria - POCT urinalysis dipstick - Urine culture    Plan:    Further f/u pending urine culture results.

## 2014-12-08 ENCOUNTER — Telehealth: Payer: Self-pay

## 2014-12-08 LAB — URINE CULTURE

## 2014-12-08 NOTE — Telephone Encounter (Signed)
-----   Message from Carmon Ginsberg, [place] sent at 12/08/2014  4:44 PM EDT ----- Continue nitrofurantoin for Enterobacter infection

## 2014-12-08 NOTE — Telephone Encounter (Signed)
Patient has been advised of culture. KW 

## 2015-01-27 DIAGNOSIS — Z23 Encounter for immunization: Secondary | ICD-10-CM | POA: Diagnosis not present

## 2015-02-09 ENCOUNTER — Ambulatory Visit
Admission: RE | Admit: 2015-02-09 | Discharge: 2015-02-09 | Disposition: A | Discharge status: Home / Self Care | Payer: Medicare Other | Source: Ambulatory Visit | Attending: Family Medicine | Admitting: Family Medicine

## 2015-02-09 ENCOUNTER — Encounter: Payer: Self-pay | Admitting: Family Medicine

## 2015-02-09 ENCOUNTER — Ambulatory Visit (INDEPENDENT_AMBULATORY_CARE_PROVIDER_SITE_OTHER): Payer: Medicare Other | Admitting: Family Medicine

## 2015-02-09 VITALS — BP 140/82 | HR 68 | Temp 98.8°F | Resp 16 | Wt 109.2 lb

## 2015-02-09 DIAGNOSIS — B349 Viral infection, unspecified: Secondary | ICD-10-CM

## 2015-02-09 DIAGNOSIS — R1030 Lower abdominal pain, unspecified: Secondary | ICD-10-CM

## 2015-02-09 DIAGNOSIS — R509 Fever, unspecified: Secondary | ICD-10-CM | POA: Insufficient documentation

## 2015-02-09 DIAGNOSIS — R05 Cough: Secondary | ICD-10-CM | POA: Insufficient documentation

## 2015-02-09 LAB — POCT URINALYSIS DIPSTICK
BILIRUBIN UA: NEGATIVE
Blood, UA: NEGATIVE
GLUCOSE UA: NEGATIVE
LEUKOCYTES UA: NEGATIVE
Nitrite, UA: NEGATIVE
Spec Grav, UA: 1.02
Urobilinogen, UA: 0.2
pH, UA: 6

## 2015-02-09 MED ORDER — DOXYCYCLINE HYCLATE 100 MG PO TABS: 100.0000 mg | ORAL_TABLET | Freq: Two times a day (BID) | ORAL | Status: DC

## 2015-02-09 NOTE — Progress Notes (Signed)
Subjective:     Patient ID: Susan Howard, female   DOB: 1970-12-12, 44 y.o.   MRN: [id number]  HPI  Chief Complaint  Patient presents with  . Groin Pain    patient states over weekend she had noticed pain in her lymph node in her groin, on Monday patient reports pain subsided. [id number] patient reports having episode of vertigo while doing laundry, she states feeling dizzy, nausea and fatigue. Wednesday pain in groin returned and patient reports pain in her kidney area, last night patient developed fever of 102 and chest pain when taking deep breaths. Patient reports taking otc Ibuprofen and Tylenol  Reports having the flu shot two weeks ago. Concerned about recurrent UTI or left gluteal abscess which she has had previously. Last UTI 12/06/14 with Enterobacter sensitive to doxycycline.   Review of Systems  Constitutional: Negative for fever and chills.  HENT: Positive for sore throat (mild).   Respiratory: Positive for cough.   Genitourinary: Negative for dysuria.       Objective:   Physical Exam  Constitutional: She appears well-developed and well-nourished. No distress.  Skin:  Site of prior abscess without erythema, fluctuance, or drainage.  Ears: T.M's intact without inflammation Throat: no tonsillar enlargement or exudate Neck: no cervical adenopathy Lungs: clear     Assessment:    1. Groin pain, unspecified laterality - POCT urinalysis dipstick  2. Fever, unspecified fever cause - DG Chest 2 View; Future - doxycycline (VIBRA-TABS) 100 MG tablet; Take 1 tablet (100 mg total) by mouth 2 (two) times daily.  Dispense: 20 tablet; Refill: 0  3. Viral syndrome - DG Chest 2 View; Future    Plan:    To start doxycycline if adverse finding on CXR or develops further urinary sx over the next day or two.

## 2015-02-09 NOTE — Patient Instructions (Signed)
We will call you with the x-ray result. 

## 2015-04-17 IMAGING — CR RIGHT TIBIA AND FIBULA - 2 VIEW
1 series · 4 of 4 positions shown · non-contrast
Comparison: None.

CLINICAL DATA: Low back yesterday while biking. Bit in the
posterior calf. History of spina bifida.

EXAM:
RIGHT TIBIA AND FIBULA - 2 VIEW

[Series 1: ap · 0.17mm/px · 4 of 4 slices shown]
[im 1/4]
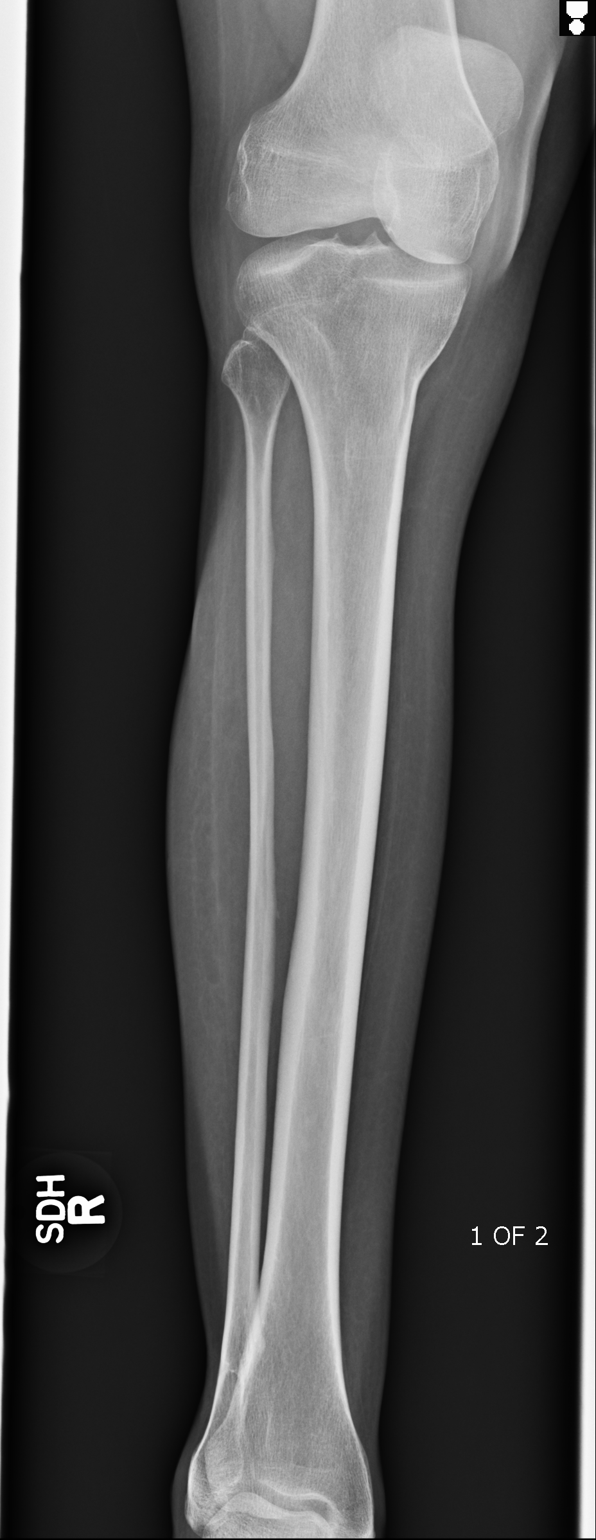
[im 2/4]
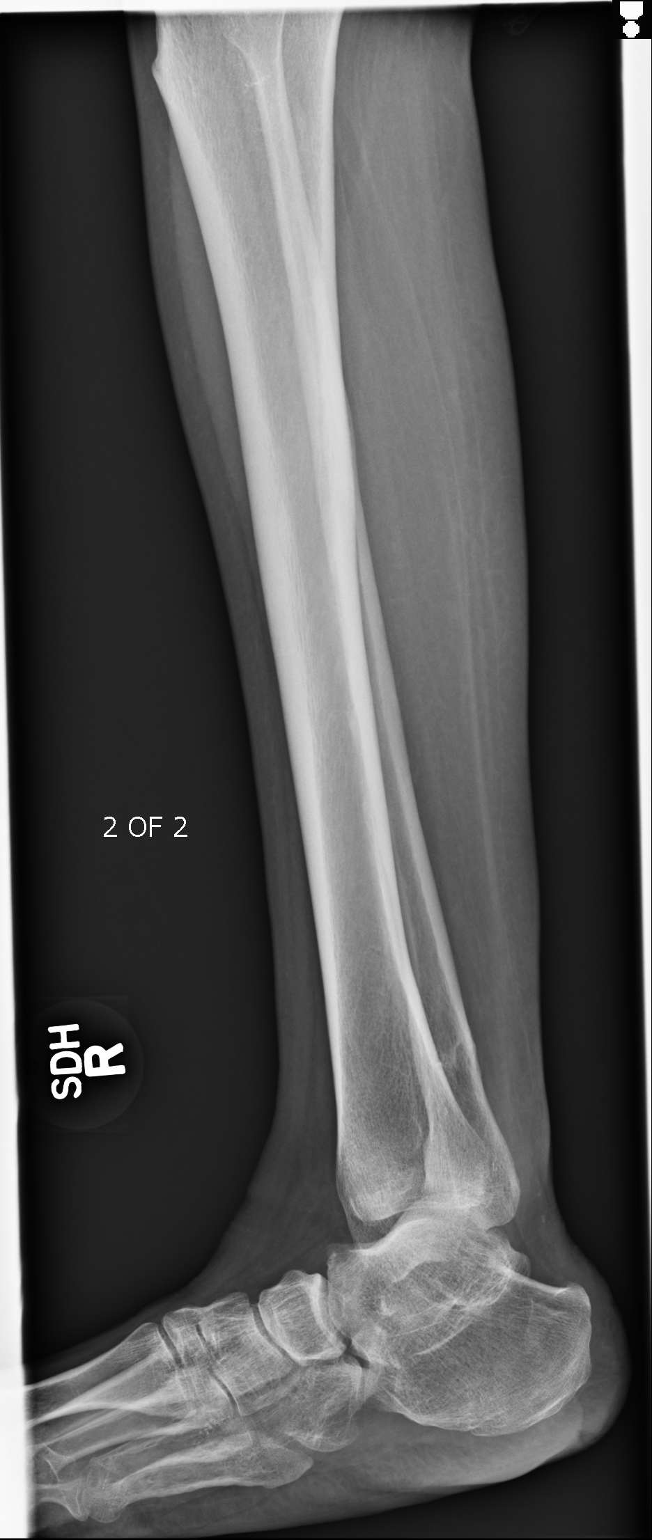
[im 3/4]
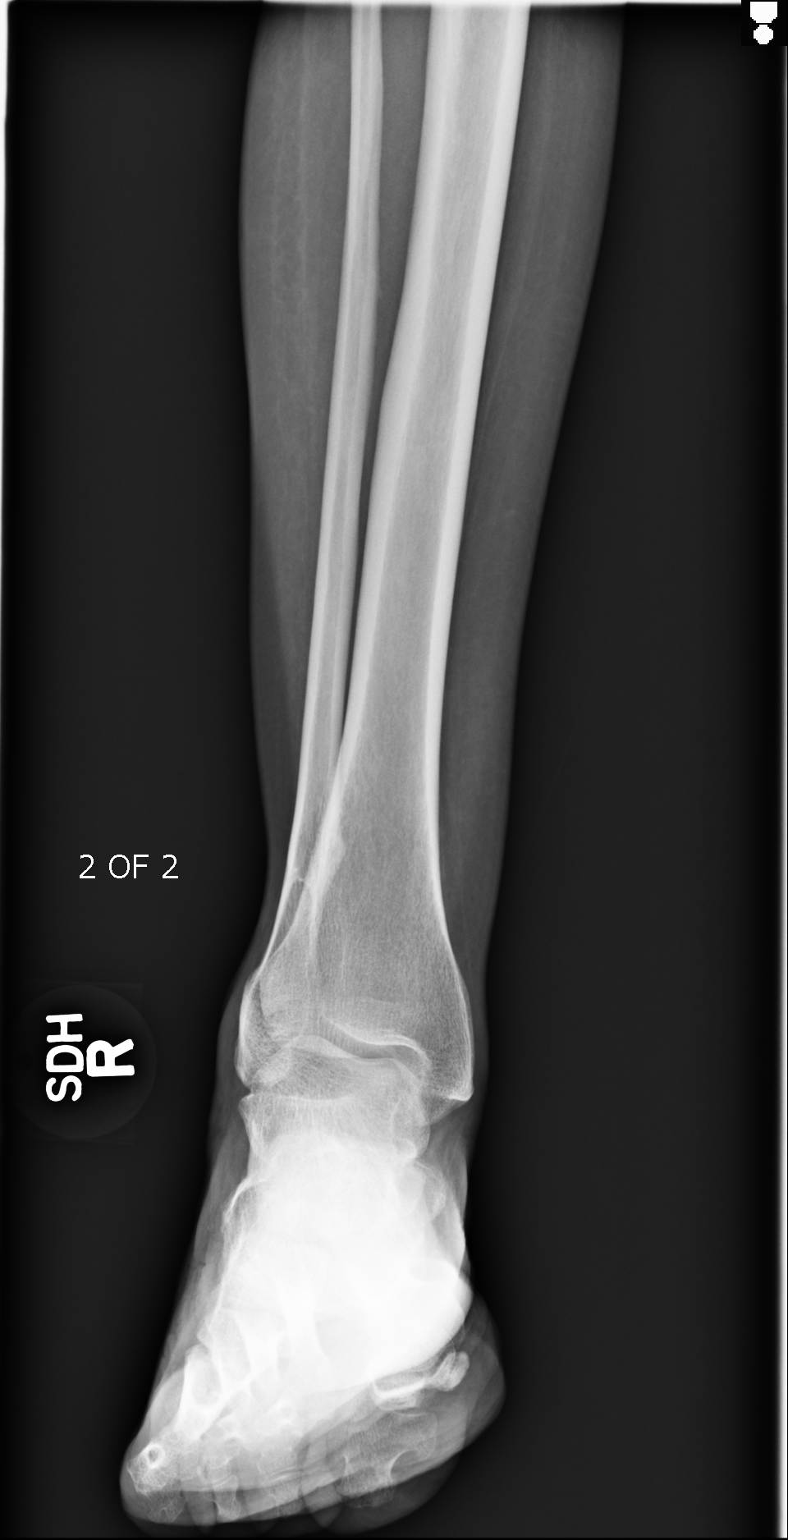
[im 4/4]
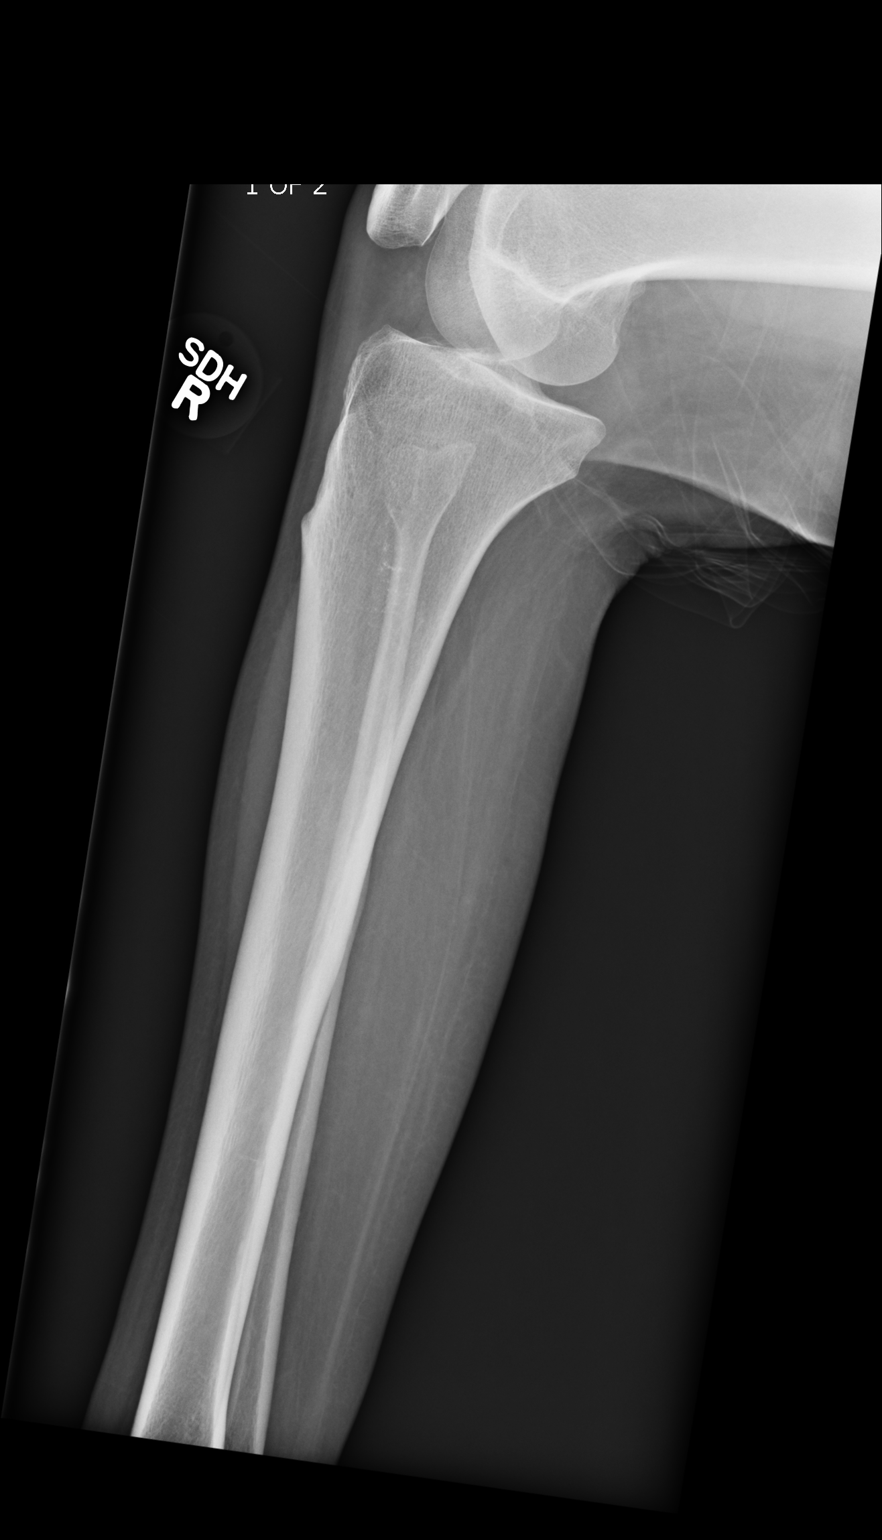

[4 of 4 positions shown; findings below may reference images not displayed]

FINDINGS: There is no evidence of fracture or other focal bone lesions. Soft
tissues are unremarkable.
IMPRESSION: Negative.

## 2015-08-09 ENCOUNTER — Ambulatory Visit (INDEPENDENT_AMBULATORY_CARE_PROVIDER_SITE_OTHER): Payer: Medicare Other | Admitting: Family Medicine

## 2015-08-09 ENCOUNTER — Encounter: Payer: Self-pay | Admitting: Family Medicine

## 2015-08-09 VITALS — BP 122/8 | HR 64 | Temp 98.4°F | Resp 14 | Wt 107.2 lb

## 2015-08-09 DIAGNOSIS — Z029 Encounter for administrative examinations, unspecified: Secondary | ICD-10-CM | POA: Diagnosis not present

## 2015-08-09 NOTE — Progress Notes (Signed)
Subjective:     Patient ID: Susan Howard, female   DOB: June 06, 1970, 45 y.o.   MRN: [id number]  HPI  Chief Complaint  Patient presents with  . Employment Physical    Patient comes in office today to recieve clearance for Health Assessment and emoloyee test through [facility]. 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   Patient states she was nervous and that pushed up her blood pressure. She already does the required duties through her clinicals. Continues to run half marathons.   Review of Systems     Objective:   Physical Exam  Constitutional: She appears well-developed and well-nourished. No distress.  Cardiovascular: Normal rate and regular rhythm.   Pulmonary/Chest: Breath sounds normal.  Musculoskeletal:  5/5 bilateral shoulders;EF/EE Lower extremities 5/5 except Right PF 1/5; left DF/PF 1/5.       Assessment:    1. Administrative encounter: Form completed for no restrictions and faxed today.    Plan:    Full participation in nursing duties despite hx of spina bifida

## 2015-08-09 NOTE — Patient Instructions (Signed)
We will fax your form today.

## 2015-10-03 DIAGNOSIS — N312 Flaccid neuropathic bladder, not elsewhere classified: Secondary | ICD-10-CM | POA: Diagnosis not present

## 2015-10-26 ENCOUNTER — Other Ambulatory Visit: Payer: Self-pay | Admitting: Family Medicine

## 2015-10-26 ENCOUNTER — Telehealth: Payer: Self-pay | Admitting: Emergency Medicine

## 2015-10-26 DIAGNOSIS — Z111 Encounter for screening for respiratory tuberculosis: Secondary | ICD-10-CM

## 2015-10-26 NOTE — Telephone Encounter (Signed)
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 She wants to know if she can get an order for the blood test for this. Thanks.

## 2015-10-26 NOTE — Telephone Encounter (Signed)
Order has been sent to [facility]. If there is a problem let us know and we will print out the request as well.

## 2015-10-26 NOTE — Telephone Encounter (Signed)
Pt advised. Susan Howard, CMA  

## 2015-10-29 LAB — QUANTIFERON IN TUBE
QUANTIFERON MITOGEN VALUE: 9.91 IU/mL
QUANTIFERON NIL VALUE: 0.03 [IU]/mL
QUANTIFERON TB AG VALUE: 0.02 [IU]/mL
QUANTIFERON TB GOLD: NEGATIVE

## 2015-10-29 LAB — QUANTIFERON TB GOLD ASSAY (BLOOD)

## 2015-10-30 ENCOUNTER — Telehealth: Payer: Self-pay

## 2015-10-30 NOTE — Telephone Encounter (Signed)
-----   Message from Carmon Ginsberg, [place] sent at 10/29/2015  4:43 PM EDT ----- TB test is negative. 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

## 2015-10-30 NOTE — Telephone Encounter (Signed)
Patient advised as below.  

## 2015-11-08 ENCOUNTER — Ambulatory Visit (INDEPENDENT_AMBULATORY_CARE_PROVIDER_SITE_OTHER): Payer: Medicare Other | Admitting: Family Medicine

## 2015-11-08 ENCOUNTER — Encounter: Payer: Self-pay | Admitting: Family Medicine

## 2015-11-08 VITALS — BP 118/62 | HR 68 | Temp 98.0°F | Resp 16 | Wt 105.0 lb

## 2015-11-08 DIAGNOSIS — H6982 Other specified disorders of Eustachian tube, left ear: Secondary | ICD-10-CM

## 2015-11-08 MED ORDER — FLUTICASONE PROPIONATE 50 MCG/ACT NA SUSP: 2.0000 | Freq: Every day | NASAL | Status: DC

## 2015-11-08 NOTE — Patient Instructions (Signed)
Let me know if the nasal spray helps.

## 2015-11-08 NOTE — Progress Notes (Signed)
Subjective:     Patient ID: Susan Howard, female   DOB: 03-25-1971, 45 y.o.   MRN: [id number]  HPI  Chief Complaint  Patient presents with  . Ear Fullness    X 1 week. Patient reports that she has fulllness in her left ear.  Patient denies any pain or tenderness. She has tried Sudafed with no relief.   Denies precedent allergy or cold sx. Occasionally swims and rarely uses Q-tips. Hears a "suds" like sound which is "non-stop" and can be triggered by pushing around her ear.   Review of Systems     Objective:   Physical Exam  Constitutional: She appears well-developed and well-nourished. No distress.  HENT:  Right Ear: Tympanic membrane and ear canal normal.  Left Ear: Tympanic membrane and ear canal normal.       Assessment:    1. Eustachian tube dysfunction, left - fluticasone (FLONASE) 50 MCG/ACT nasal spray; Place 2 sprays into both nostrils daily.  Dispense: 16 g; Refill: 0    Plan:    Further f/u if not improving.

## 2016-01-15 DIAGNOSIS — Z23 Encounter for immunization: Secondary | ICD-10-CM | POA: Diagnosis not present

## 2016-01-23 ENCOUNTER — Ambulatory Visit (INDEPENDENT_AMBULATORY_CARE_PROVIDER_SITE_OTHER): Payer: Medicare Other | Admitting: Family Medicine

## 2016-01-23 ENCOUNTER — Encounter: Payer: Self-pay | Admitting: Family Medicine

## 2016-01-23 VITALS — BP 120/74 | HR 58 | Temp 97.8°F | Resp 15 | Wt 109.6 lb

## 2016-01-23 DIAGNOSIS — R070 Pain in throat: Secondary | ICD-10-CM | POA: Diagnosis not present

## 2016-01-23 DIAGNOSIS — R5383 Other fatigue: Secondary | ICD-10-CM

## 2016-01-23 MED ORDER — RANITIDINE HCL 150 MG PO TABS: 150.0000 mg | ORAL_TABLET | Freq: Two times a day (BID) | ORAL | 1 refills | Status: DC

## 2016-01-23 NOTE — Patient Instructions (Signed)
We will call you with the lab results. 

## 2016-01-23 NOTE — Progress Notes (Signed)
Subjective:     Patient ID: Susan Howard, female   DOB: 02-Nov-1970, 45 y.o.   MRN: [id number]  HPI  Chief Complaint  Patient presents with  . Sore Throat    Patient comes in office today with concerns of sore throat for the past 4 weeks. Patient states "it feels like a hair ball in my throat". Associated with sore throat patient complains of fatigue, weakness, white spots on back of throat and blister in back of throat.   States she has had intermittent troubles with heartburn/reflux and feels burning in her upper chest area as well. 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 No hx of mono reported. Continues to run marathons.   Review of Systems     Objective:   Physical Exam  Constitutional: She appears well-developed and well-nourished. No distress.  HENT:  Mouth/Throat: No oropharyngeal exudate.  No tonsillar enlargement or exudate  Pulmonary/Chest: Breath sounds normal. She has no wheezes.       Assessment:    1. Other fatigue - CBC with Differential/Platelet - Epstein-Barr virus VCA antibody panel  2. Throat discomfort: ? Globus ? EBV? Reflux mediated - ranitidine (ZANTAC) 150 MG tablet; Take 1 tablet (150 mg total) by mouth 2 (two) times daily.  Dispense: 28 tablet; Refill: 1 - Epstein-Barr virus VCA antibody panel    Plan:    Further f/u pending lab work.

## 2016-01-24 ENCOUNTER — Telehealth: Payer: Self-pay

## 2016-01-24 LAB — CBC WITH DIFFERENTIAL/PLATELET
BASOS: 1 %
Basophils Absolute: 0.1 10*3/uL (ref 0.0–0.2)
EOS (ABSOLUTE): 0.1 10*3/uL (ref 0.0–0.4)
EOS: 1 %
HEMATOCRIT: 36.3 % (ref 34.0–46.6)
HEMOGLOBIN: 11.3 g/dL (ref 11.1–15.9)
IMMATURE GRANS (ABS): 0 10*3/uL (ref 0.0–0.1)
Immature Granulocytes: 0 %
LYMPHS ABS: 1.5 10*3/uL (ref 0.7–3.1)
LYMPHS: 25 %
MCH: 25.5 pg — AB (ref 26.6–33.0)
MCHC: 31.1 g/dL — ABNORMAL LOW (ref 31.5–35.7)
MCV: 82 fL (ref 79–97)
MONOCYTES: 8 %
Monocytes Absolute: 0.5 10*3/uL (ref 0.1–0.9)
NEUTROS ABS: 3.9 10*3/uL (ref 1.4–7.0)
Neutrophils: 65 %
Platelets: 330 10*3/uL (ref 150–379)
RBC: 4.43 x10E6/uL (ref 3.77–5.28)
RDW: 15.6 % — ABNORMAL HIGH (ref 12.3–15.4)
WBC: 6.1 10*3/uL (ref 3.4–10.8)

## 2016-01-24 LAB — EPSTEIN-BARR VIRUS VCA ANTIBODY PANEL
EBV EARLY ANTIGEN AB, IGG: 19.4 U/mL — AB (ref 0.0–8.9)
EBV NA IgG: >600 U/mL — ABNORMAL HIGH (ref 0.0–17.9)
EBV VCA IGG: 85.6 U/mL — AB (ref 0.0–17.9)

## 2016-01-24 NOTE — Telephone Encounter (Signed)
Advised pt of lab results. Pt verbally acknowledges understanding. Emily Drozdowski, CMA   

## 2016-01-24 NOTE — Telephone Encounter (Signed)
-----   Message from Carmon Ginsberg, [place] sent at 01/24/2016  7:45 AM EDT ----- No anemia. Mono antibodies not available yet.

## 2016-01-25 ENCOUNTER — Telehealth: Payer: Self-pay

## 2016-01-25 NOTE — Telephone Encounter (Signed)
-----   Message from Carmon Ginsberg, [place] sent at 01/24/2016  1:53 PM EDT ----- Antibody tests most consistent with remote past infection or convalescing from recent  infection.

## 2016-01-25 NOTE — Telephone Encounter (Signed)
Patient was advised. KW 

## 2016-02-01 ENCOUNTER — Ambulatory Visit (INDEPENDENT_AMBULATORY_CARE_PROVIDER_SITE_OTHER): Payer: Medicare Other | Admitting: Physician Assistant

## 2016-02-01 ENCOUNTER — Encounter: Payer: Self-pay | Admitting: Physician Assistant

## 2016-02-01 VITALS — BP 110/72 | HR 64 | Temp 98.4°F | Resp 16 | Wt 110.0 lb

## 2016-02-01 DIAGNOSIS — J069 Acute upper respiratory infection, unspecified: Secondary | ICD-10-CM | POA: Diagnosis not present

## 2016-02-01 MED ORDER — DOXYCYCLINE HYCLATE 100 MG PO TABS: 100.0000 mg | ORAL_TABLET | Freq: Two times a day (BID) | ORAL | 0 refills | Status: DC

## 2016-02-01 NOTE — Patient Instructions (Signed)
Upper Respiratory Infection, Adult Most upper respiratory infections (URIs) are a viral infection of the air passages leading to the lungs. A URI affects the nose, throat, and upper air passages. The most common type of URI is nasopharyngitis and is typically referred to as "the common cold." URIs run their course and usually go away on their own. Most of the time, a URI does not require medical attention, but sometimes a bacterial infection in the upper airways can follow a viral infection. This is called a secondary infection. Sinus and middle ear infections are common types of secondary upper respiratory infections. Bacterial pneumonia can also complicate a URI. A URI can worsen asthma and chronic obstructive pulmonary disease (COPD). Sometimes, these complications can require emergency medical care and may be life threatening.  CAUSES Almost all URIs are caused by viruses. A virus is a type of germ and can spread from one person to another.  RISKS FACTORS You may be at risk for a URI if:   You smoke.   You have chronic heart or lung disease.  You have a weakened defense (immune) system.   You are very young or very old.   You have nasal allergies or asthma.  You work in crowded or poorly ventilated areas.  You work in health care facilities or schools. SIGNS AND SYMPTOMS  Symptoms typically develop 2-3 days after you come in contact with a cold virus. Most viral URIs last 7-10 days. However, viral URIs from the influenza virus (flu virus) can last 14-18 days and are typically more severe. Symptoms may include:   Runny or stuffy (congested) nose.   Sneezing.   Cough.   Sore throat.   Headache.   Fatigue.   Fever.   Loss of appetite.   Pain in your forehead, behind your eyes, and over your cheekbones (sinus pain).  Muscle aches.  DIAGNOSIS  Your health care provider may diagnose a URI by:  Physical exam.  Tests to check that your symptoms are not due to  another condition such as:  Strep throat.  Sinusitis.  Pneumonia.  Asthma. TREATMENT  A URI goes away on its own with time. It cannot be cured with medicines, but medicines may be prescribed or recommended to relieve symptoms. Medicines may help:  Reduce your fever.  Reduce your cough.  Relieve nasal congestion. HOME CARE INSTRUCTIONS   Take medicines only as directed by your health care provider.   Gargle warm saltwater or take cough drops to comfort your throat as directed by your health care provider.  Use a warm mist humidifier or inhale steam from a shower to increase air moisture. This may make it easier to breathe.  Drink enough fluid to keep your urine clear or pale yellow.   Eat soups and other clear broths and maintain good nutrition.   Rest as needed.   Return to work when your temperature has returned to normal or as your health care provider advises. You may need to stay home longer to avoid infecting others. You can also use a face mask and careful hand washing to prevent spread of the virus.  Increase the usage of your inhaler if you have asthma.   Do not use any tobacco products, including cigarettes, chewing tobacco, or electronic cigarettes. If you need help quitting, ask your health care provider. PREVENTION  The best way to protect yourself from getting a cold is to practice good hygiene.   Avoid oral or hand contact with people with cold   symptoms.   Wash your hands often if contact occurs.  There is no clear evidence that vitamin C, vitamin E, echinacea, or exercise reduces the chance of developing a cold. However, it is always recommended to get plenty of rest, exercise, and practice good nutrition.  SEEK MEDICAL CARE IF:   You are getting worse rather than better.   Your symptoms are not controlled by medicine.   You have chills.  You have worsening shortness of breath.  You have brown or red mucus.  You have yellow or brown nasal  discharge.  You have pain in your face, especially when you bend forward.  You have a fever.  You have swollen neck glands.  You have pain while swallowing.  You have white areas in the back of your throat. SEEK IMMEDIATE MEDICAL CARE IF:   You have severe or persistent:  Headache.  Ear pain.  Sinus pain.  Chest pain.  You have chronic lung disease and any of the following:  Wheezing.  Prolonged cough.  Coughing up blood.  A change in your usual mucus.  You have a stiff neck.  You have changes in your:  Vision.  Hearing.  Thinking.  Mood. MAKE SURE YOU:   Understand these instructions.  Will watch your condition.  Will get help right away if you are not doing well or get worse.   This information is not intended to replace advice given to you by your health care provider. Make sure you discuss any questions you have with your health care provider.   Document Released: 10/01/2000 Document Revised: 08/22/2014 Document Reviewed: 07/13/2013 Elsevier Interactive Patient Education 2016 [employer]  

## 2016-02-01 NOTE — Progress Notes (Signed)
Patient: Susan Howard Female    DOB: June 05, 1970   45 y.o.   MRN: [id number] Visit Date: 02/01/2016  Today's Provider: Trinna Post, PA-C   Chief Complaint  Patient presents with  . Sore Throat   Subjective:    HPI Pt comes in today complaining of an ongoing "sore throat", and cough.  She says her sore throat comes and goes and feels still occasionally feels like a "Fur ball stuck in her throat".  She says she coughs more in the morning which is now making her chest feel "Raw". Nothing makes it better or worse. She says the sore throat has been ongoing for five weeks. She saw Mikki Santee a couple of weeks ago and was prescribed Zantac, which she took for one week and then stopped. She reports she had this same issue 3-4 years ago and took an antibiotic, which cleared it up.    Allergies  Allergen Reactions  . Latex Other (See Comments)    Respiratory distress: patient allergic to ALL LATEX  . Chlorine Itching and Other (See Comments)    Allergen - chlorine Reaction - sneezing  . Tape Itching    Allergen - pink tape  . Clindamycin/Lincomycin Rash  . Septra [Sulfamethoxazole-Trimethoprim] Rash     Current Outpatient Prescriptions:  .  doxycycline (VIBRA-TABS) 100 MG tablet, Take 1 tablet (100 mg total) by mouth 2 (two) times daily., Disp: 20 tablet, Rfl: 0 .  fluticasone (FLONASE) 50 MCG/ACT nasal spray, Place 2 sprays into both nostrils daily. (Patient not taking: Reported on 02/01/2016), Disp: 16 g, Rfl: 0 .  ranitidine (ZANTAC) 150 MG tablet, Take 1 tablet (150 mg total) by mouth 2 (two) times daily. (Patient not taking: Reported on 02/01/2016), Disp: 28 tablet, Rfl: 1  Review of Systems  HENT: Positive for voice change. Negative for congestion, dental problem, drooling, ear discharge, ear pain, mouth sores, postnasal drip, rhinorrhea, sinus pressure, sneezing, sore throat, tinnitus and trouble swallowing.   Eyes: Negative for photophobia, pain, discharge, redness, itching  and visual disturbance.  Respiratory: Positive for cough and shortness of breath. Negative for apnea, choking, chest tightness, wheezing and stridor.   Gastrointestinal: Negative for abdominal distention, abdominal pain, anal bleeding, blood in stool, constipation, diarrhea, nausea, rectal pain and vomiting.  Musculoskeletal: Negative.   Neurological: Negative for dizziness, light-headedness and headaches.  Hematological: Does not bruise/bleed easily.    Social History  Substance Use Topics  . Smoking status: Never Smoker  . Smokeless tobacco: Never Used  . Alcohol use No   Objective:   BP 110/72 (BP Location: Left Arm, Patient Position: Sitting, Cuff Size: Normal)   Pulse 64   Temp 98.4 F (36.9 C) (Oral)   Resp 16   Wt 110 lb (49.9 kg)   LMP 01/04/2016   Physical Exam  Constitutional: She is oriented to person, place, and time. She appears well-developed and well-nourished. No distress.  HENT:  Left Ear: External ear normal.  Nose: Nose normal.  Mouth/Throat: Oropharynx is clear and moist. No oropharyngeal exudate.  Eyes: Right eye exhibits no discharge. Left eye exhibits no discharge.  Neck: Normal range of motion. Neck supple.  Cardiovascular: Normal rate and regular rhythm.   Pulmonary/Chest: Effort normal and breath sounds normal. No respiratory distress. She has no wheezes. She has no rales.  Lymphadenopathy:    She has no cervical adenopathy.  Neurological: She is alert and oriented to person, place, and time.  Skin: Skin is warm and  dry. She is not diaphoretic.  Psychiatric: She has a normal mood and affect. Her behavior is normal.        Assessment & Plan:      Problem List Items Addressed This Visit    None    Visit Diagnoses    Upper respiratory tract infection, unspecified type    -  Primary   Relevant Medications   doxycycline (VIBRA-TABS) 100 MG tablet     Treat as above. Patient to follow up with Mikki Santee if no improvement.   Return if symptoms worsen  or fail to improve.   Patient Instructions  Upper Respiratory Infection, Adult Most upper respiratory infections (URIs) are a viral infection of the air passages leading to the lungs. A URI affects the nose, throat, and upper air passages. The most common type of URI is nasopharyngitis and is typically referred to as "the common cold." URIs run their course and usually go away on their own. Most of the time, a URI does not require medical attention, but sometimes a bacterial infection in the upper airways can follow a viral infection. This is called a secondary infection. Sinus and middle ear infections are common types of secondary upper respiratory infections. Bacterial pneumonia can also complicate a URI. A URI can worsen asthma and chronic obstructive pulmonary disease (COPD). Sometimes, these complications can require emergency medical care and may be life threatening.  CAUSES Almost all URIs are caused by viruses. A virus is a type of germ and can spread from one person to another.  RISKS FACTORS You may be at risk for a URI if:   You smoke.   You have chronic heart or lung disease.  You have a weakened defense (immune) system.   You are very young or very old.   You have nasal allergies or asthma.  You work in crowded or poorly ventilated areas.  You work in health care facilities or schools. SIGNS AND SYMPTOMS  Symptoms typically develop 2-3 days after you come in contact with a cold virus. Most viral URIs last 7-10 days. However, viral URIs from the influenza virus (flu virus) can last 14-18 days and are typically more severe. Symptoms may include:   Runny or stuffy (congested) nose.   Sneezing.   Cough.   Sore throat.   Headache.   Fatigue.   Fever.   Loss of appetite.   Pain in your forehead, behind your eyes, and over your cheekbones (sinus pain).  Muscle aches.  DIAGNOSIS  Your health care provider may diagnose a URI by:  Physical  exam.  Tests to check that your symptoms are not due to another condition such as:  Strep throat.  Sinusitis.  Pneumonia.  Asthma. TREATMENT  A URI goes away on its own with time. It cannot be cured with medicines, but medicines may be prescribed or recommended to relieve symptoms. Medicines may help:  Reduce your fever.  Reduce your cough.  Relieve nasal congestion. HOME CARE INSTRUCTIONS   Take medicines only as directed by your health care provider.   Gargle warm saltwater or take cough drops to comfort your throat as directed by your health care provider.  Use a warm mist humidifier or inhale steam from a shower to increase air moisture. This may make it easier to breathe.  Drink enough fluid to keep your urine clear or pale yellow.   Eat soups and other clear broths and maintain good nutrition.   Rest as needed.   Return to work  when your temperature has returned to normal or as your health care provider advises. You may need to stay home longer to avoid infecting others. You can also use a face mask and careful hand washing to prevent spread of the virus.  Increase the usage of your inhaler if you have asthma.   Do not use any tobacco products, including cigarettes, chewing tobacco, or electronic cigarettes. If you need help quitting, ask your health care provider. PREVENTION  The best way to protect yourself from getting a cold is to practice good hygiene.   Avoid oral or hand contact with people with cold symptoms.   Wash your hands often if contact occurs.  There is no clear evidence that vitamin C, vitamin E, echinacea, or exercise reduces the chance of developing a cold. However, it is always recommended to get plenty of rest, exercise, and practice good nutrition.  SEEK MEDICAL CARE IF:   You are getting worse rather than better.   Your symptoms are not controlled by medicine.   You have chills.  You have worsening shortness of breath.  You  have brown or red mucus.  You have yellow or brown nasal discharge.  You have pain in your face, especially when you bend forward.  You have a fever.  You have swollen neck glands.  You have pain while swallowing.  You have white areas in the back of your throat. SEEK IMMEDIATE MEDICAL CARE IF:   You have severe or persistent:  Headache.  Ear pain.  Sinus pain.  Chest pain.  You have chronic lung disease and any of the following:  Wheezing.  Prolonged cough.  Coughing up blood.  A change in your usual mucus.  You have a stiff neck.  You have changes in your:  Vision.  Hearing.  Thinking.  Mood. MAKE SURE YOU:   Understand these instructions.  Will watch your condition.  Will get help right away if you are not doing well or get worse.   This information is not intended to replace advice given to you by your health care provider. Make sure you discuss any questions you have with your health care provider.   Document Released: 10/01/2000 Document Revised: 08/22/2014 Document Reviewed: 07/13/2013 Elsevier Interactive Patient Education 2016 [place], [facility]

## 2016-02-27 IMAGING — CR DG CHEST 2V
1 series · 2 of 2 positions shown · non-contrast
Comparison: None.

CLINICAL DATA: Cough and fever

EXAM:
CHEST  2 VIEW

[Series 1: dg chest 2 view · 0.14mm/px · 2 of 2 slices shown]
[im 1/2]
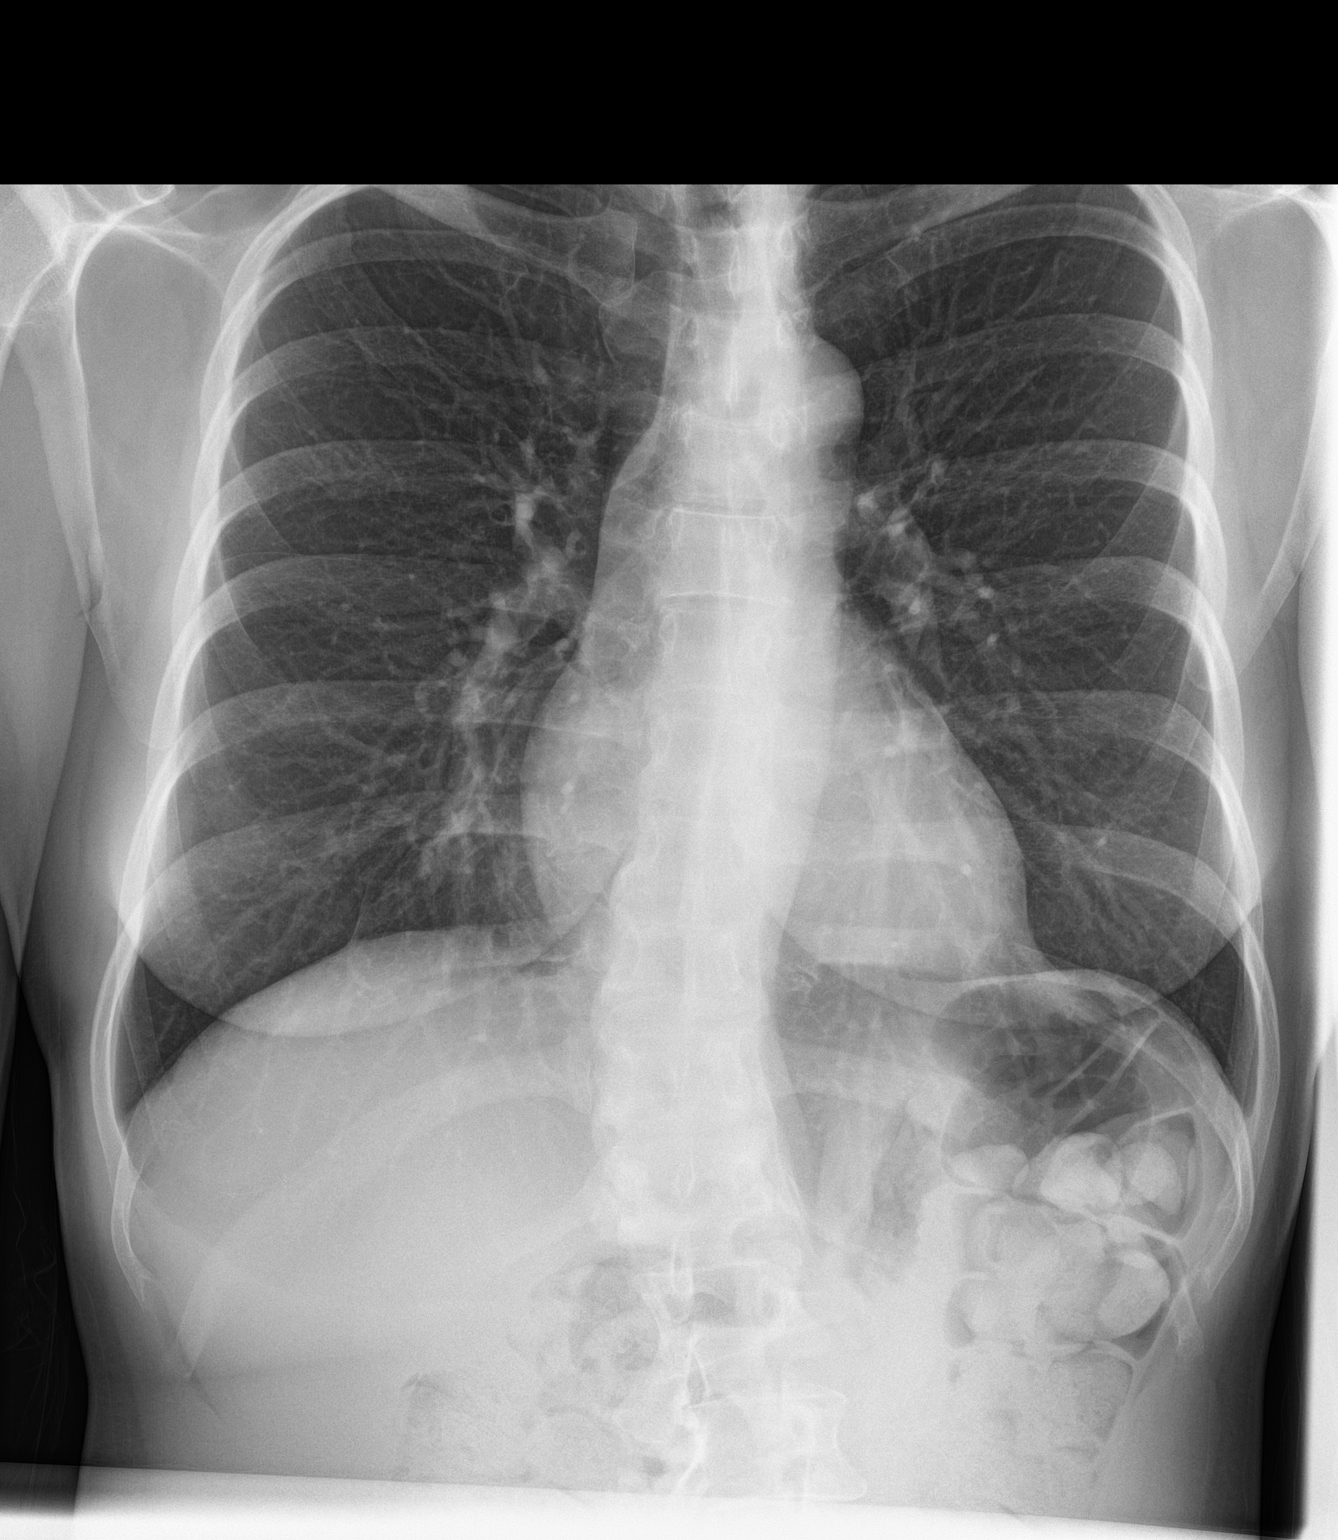
[im 2/2]
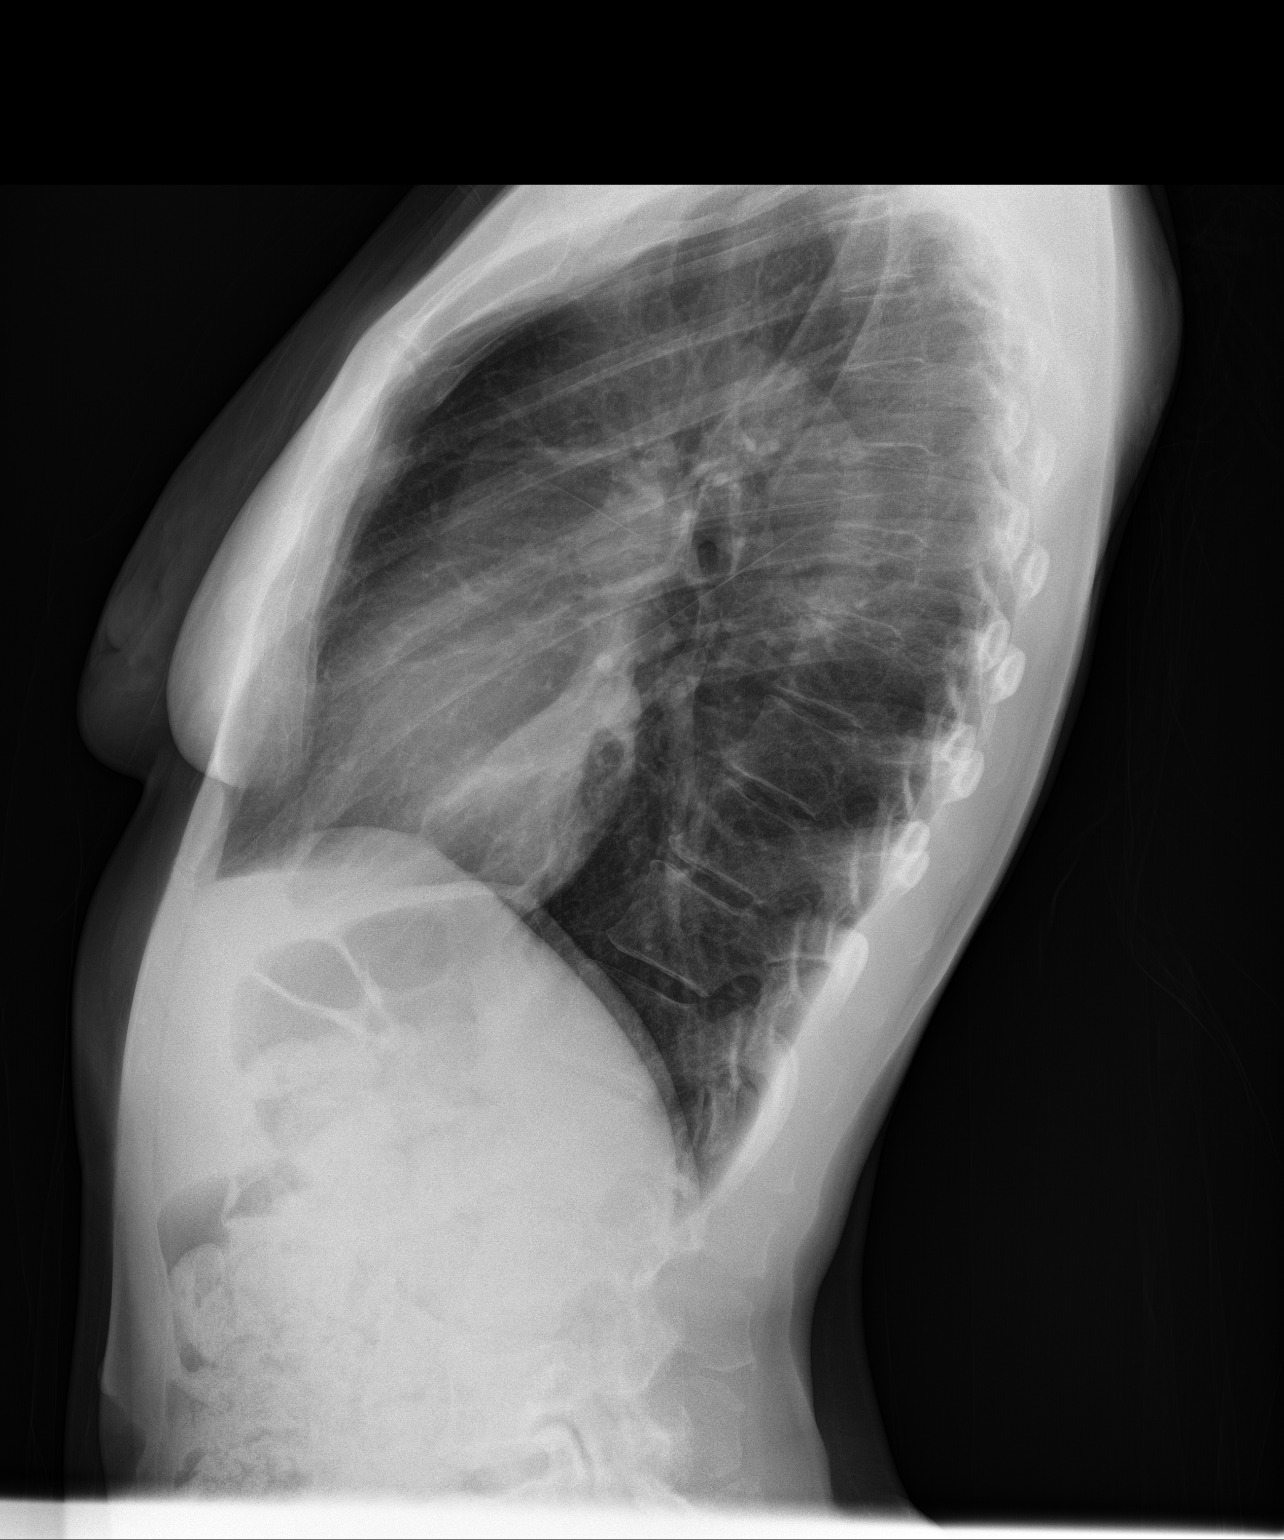

[2 of 2 positions shown; findings below may reference images not displayed]

FINDINGS: Lungs are clear without infiltrate or effusion. Negative for heart
failure. Thoracic and lumbar scoliosis without acute skeletal
abnormality.
IMPRESSION: No active cardiopulmonary disease.

## 2016-10-01 DIAGNOSIS — N312 Flaccid neuropathic bladder, not elsewhere classified: Secondary | ICD-10-CM | POA: Diagnosis not present

## 2016-10-01 DIAGNOSIS — Q760 Spina bifida occulta: Secondary | ICD-10-CM | POA: Diagnosis not present

## 2016-10-01 DIAGNOSIS — Z960 Presence of urogenital implants: Secondary | ICD-10-CM | POA: Diagnosis not present

## 2016-10-01 DIAGNOSIS — N319 Neuromuscular dysfunction of bladder, unspecified: Secondary | ICD-10-CM | POA: Diagnosis not present

## 2016-10-03 ENCOUNTER — Encounter: Payer: Self-pay | Admitting: Family Medicine

## 2016-10-03 ENCOUNTER — Ambulatory Visit (INDEPENDENT_AMBULATORY_CARE_PROVIDER_SITE_OTHER): Payer: Medicare Other | Admitting: Family Medicine

## 2016-10-03 VITALS — BP 110/70 | HR 72 | Temp 98.1°F | Resp 16 | Wt 106.4 lb

## 2016-10-03 DIAGNOSIS — Z029 Encounter for administrative examinations, unspecified: Secondary | ICD-10-CM

## 2016-10-03 NOTE — Progress Notes (Signed)
Patient ID: Susan Howard, female   DOB: May 01, 1970, 46 y.o.   MRN: [id number] Chief Complaint  Patient presents with  . 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 Patient states that she just graduated from [employer] with bachelors in nursing and needs a letter specifying her medical condtion, what duties she is able to perform and if she is able to lift 40lbs  She is physically active participating in marathons and has lifted 40# in testing at work. Letter provided stating that she is capable of performing nursing duties.

## 2016-10-14 ENCOUNTER — Ambulatory Visit (INDEPENDENT_AMBULATORY_CARE_PROVIDER_SITE_OTHER): Payer: Medicare Other | Admitting: Family Medicine

## 2016-10-14 ENCOUNTER — Encounter: Payer: Self-pay | Admitting: Family Medicine

## 2016-10-14 VITALS — BP 110/62 | HR 64 | Temp 98.9°F | Resp 16 | Wt 107.0 lb

## 2016-10-14 DIAGNOSIS — L89329 Pressure ulcer of left buttock, unspecified stage: Secondary | ICD-10-CM

## 2016-10-14 DIAGNOSIS — Z8614 Personal history of Methicillin resistant Staphylococcus aureus infection: Secondary | ICD-10-CM

## 2016-10-14 MED ORDER — DOXYCYCLINE HYCLATE 100 MG PO TABS: 100.0000 mg | ORAL_TABLET | Freq: Two times a day (BID) | ORAL | 1 refills | Status: DC

## 2016-10-14 NOTE — Patient Instructions (Signed)
Use Duoderm as needed.

## 2016-10-14 NOTE — Progress Notes (Signed)
Subjective:     Patient ID: Susan Howard, female   DOB: 1970/05/13, 46 y.o.   MRN: [id number]  HPI  Chief Complaint  Patient presents with  . Hip Problem    Patient comes in office today to discuss possible infected site on her right trunk. Patient states that she had surgery at site years ago and has preciously had infections. Patient reports over weekend she was cleaning a home that was infested and states that she is unsure if she exposed her self to something. Patient reports red drainage and tenderness at site.   States she has drainage at the site of a previously healed ischial ulcer. Last treated with Duoderm in 2016 per [facility] with healing. Has also had prior surgeries to this area. No sensation to this area due to hx of spina bifida.   Review of Systems     Objective:   Physical Exam  Constitutional: She appears well-developed and well-nourished. No distress.  Skin:  Left ischium callus and central opening without drainage apparent on palpation. No overlying erythema.       Assessment:    1. Decubitus ulcer of left ischial area, unspecified pressure ulcer stage: will resume Duoderm when no longer draining. - doxycycline (VIBRA-TABS) 100 MG tablet; Take 1 tablet (100 mg total) by mouth 2 (two) times daily.  Dispense: 14 tablet; Refill: 1  2. History of methicillin resistant Staphylococcus aureus infection - doxycycline (VIBRA-TABS) 100 MG tablet; Take 1 tablet (100 mg total) by mouth 2 (two) times daily.  Dispense: 14 tablet; Refill: 1    Plan:    Further f/u as needed. Will refer back to [facility] if not improving.

## 2016-10-18 HISTORY — PX: URINARY SPHINCTER REVISION: SHX2625

## 2016-12-05 ENCOUNTER — Ambulatory Visit: Payer: Medicare Other | Admitting: Family Medicine

## 2016-12-20 DIAGNOSIS — N390 Urinary tract infection, site not specified: Secondary | ICD-10-CM

## 2016-12-20 HISTORY — DX: Urinary tract infection, site not specified: N39.0

## 2017-01-15 ENCOUNTER — Encounter: Payer: Self-pay | Admitting: Physician Assistant

## 2017-01-15 ENCOUNTER — Ambulatory Visit (INDEPENDENT_AMBULATORY_CARE_PROVIDER_SITE_OTHER): Payer: Managed Care, Other (non HMO) | Admitting: Physician Assistant

## 2017-01-15 VITALS — BP 120/84 | HR 79 | Temp 98.4°F | Resp 16 | Wt 105.0 lb

## 2017-01-15 DIAGNOSIS — N3090 Cystitis, unspecified without hematuria: Secondary | ICD-10-CM | POA: Diagnosis not present

## 2017-01-15 DIAGNOSIS — R300 Dysuria: Secondary | ICD-10-CM | POA: Diagnosis not present

## 2017-01-15 LAB — POCT URINALYSIS DIPSTICK
Bilirubin, UA: NEGATIVE
Glucose, UA: NEGATIVE
Ketones, UA: NEGATIVE
Nitrite, UA: POSITIVE
Spec Grav, UA: 1.02 (ref 1.010–1.025)
Urobilinogen, UA: 0.2 E.U./dL
pH, UA: 6 (ref 5.0–8.0)

## 2017-01-15 MED ORDER — NITROFURANTOIN MONOHYD MACRO 100 MG PO CAPS: 100.0000 mg | ORAL_CAPSULE | Freq: Two times a day (BID) | ORAL | 0 refills | Status: DC

## 2017-01-15 NOTE — Progress Notes (Signed)
Patient: Susan Howard Female    DOB: 1971-03-08   46 y.o.   MRN: [id number] Visit Date: 01/15/2017  Today's Provider: Trinna Post, PA-C   Chief Complaint  Patient presents with  . Dysuria   Subjective:    Patient states she has had a frequency and a stinging sensation upon urination for 3 weeks. Patient states she has frequent UTI's. Patient has had no other symptoms.    Dysuria   This is a new problem. The current episode started 1 to 4 weeks ago (3 weeks). The problem occurs every urination. The problem has been unchanged. The quality of the pain is described as burning. The pain is mild. There has been no fever. Associated symptoms include frequency. Pertinent negatives include no chills, discharge, flank pain, hematuria, hesitancy, nausea, possible pregnancy, sweats, urgency or vomiting. She has tried nothing for the symptoms. Her past medical history is significant for recurrent UTIs.       Allergies  Allergen Reactions  . Latex Other (See Comments)    Respiratory distress: patient allergic to ALL LATEX  . Chlorine Itching and Other (See Comments)    Allergen - chlorine Reaction - sneezing  . Tape Itching    Allergen - pink tape  . Clindamycin/Lincomycin Rash  . Septra [Sulfamethoxazole-Trimethoprim] Rash     Current Outpatient Prescriptions:  .  nitrofurantoin, macrocrystal-monohydrate, (MACROBID) 100 MG capsule, Take 1 capsule (100 mg total) by mouth 2 (two) times daily., Disp: 10 capsule, Rfl: 0  Review of Systems  Constitutional: Negative for appetite change, chills, fatigue and fever.  Respiratory: Negative for chest tightness and shortness of breath.   Cardiovascular: Negative for chest pain and palpitations.  Gastrointestinal: Negative for abdominal pain, nausea and vomiting.  Genitourinary: Positive for dysuria and frequency. Negative for flank pain, hematuria, hesitancy and urgency.  Neurological: Negative for dizziness and weakness.     Social History  Substance Use Topics  . Smoking status: Never Smoker  . Smokeless tobacco: Never Used  . Alcohol use No   Objective:   BP 120/84 (BP Location: Left Arm, Patient Position: Sitting, Cuff Size: Normal)   Pulse 79   Temp 98.4 F (36.9 C) (Oral)   Resp 16   Wt 105 lb (47.6 kg)   SpO2 99%  Vitals:   01/15/17 1404  BP: 120/84  Pulse: 79  Resp: 16  Temp: 98.4 F (36.9 C)  TempSrc: Oral  SpO2: 99%  Weight: 105 lb (47.6 kg)     Physical Exam  Constitutional: She is oriented to person, place, and time. She appears well-developed and well-nourished. No distress.  Cardiovascular: Normal rate and regular rhythm.   Pulmonary/Chest: Effort normal and breath sounds normal.  Abdominal: Soft. Bowel sounds are normal. She exhibits no distension. There is tenderness in the suprapubic area. There is no rebound, no guarding and no CVA tenderness.  Neurological: She is alert and oriented to person, place, and time.  Skin: Skin is warm and dry. She is not diaphoretic.  Psychiatric: She has a normal mood and affect. Her behavior is normal.        Assessment & Plan:     1. Dysuria  Positive dipstick today with nitrites, blood, and leukocytes.   - POCT urinalysis dipstick - Urine Culture  2. Cystitis  - nitrofurantoin, macrocrystal-monohydrate, (MACROBID) 100 MG capsule; Take 1 capsule (100 mg total) by mouth 2 (two) times daily.  Dispense: 10 capsule; Refill: 0 - Urine Culture  Return  if symptoms worsen or fail to improve.  The entirety of the information documented in the History of Present Illness, Review of Systems and Physical Exam were personally obtained by me. Portions of this information were initially documented by Ashley Royalty, CMA and reviewed by me for thoroughness and accuracy.         Trinna Post, PA-C  [facility]

## 2017-01-15 NOTE — Patient Instructions (Signed)

## 2017-01-18 LAB — URINE CULTURE
MICRO NUMBER: 81073609
SPECIMEN QUALITY:: ADEQUATE

## 2017-01-19 ENCOUNTER — Telehealth: Payer: Self-pay

## 2017-01-19 NOTE — Telephone Encounter (Signed)
Patient advised.KW 

## 2017-01-19 NOTE — Telephone Encounter (Signed)
-----   Message from Birdie Sons, MD sent at 01/19/2017 12:23 PM EDT ----- Urine culture shows infection sensitive to antibiotic that was prescribed. Symptoms should completely resolve by the time antibiotic is finished. Call back otherwise.

## 2017-01-20 ENCOUNTER — Encounter: Payer: Self-pay | Admitting: Certified Nurse Midwife

## 2017-01-20 ENCOUNTER — Ambulatory Visit (INDEPENDENT_AMBULATORY_CARE_PROVIDER_SITE_OTHER): Payer: Medicare Other | Admitting: Certified Nurse Midwife

## 2017-01-20 VITALS — BP 140/90 | HR 66 | Ht 61.0 in | Wt 106.0 lb

## 2017-01-20 DIAGNOSIS — Z01419 Encounter for gynecological examination (general) (routine) without abnormal findings: Secondary | ICD-10-CM | POA: Diagnosis not present

## 2017-01-20 DIAGNOSIS — Z803 Family history of malignant neoplasm of breast: Secondary | ICD-10-CM | POA: Diagnosis not present

## 2017-01-20 DIAGNOSIS — Z1211 Encounter for screening for malignant neoplasm of colon: Secondary | ICD-10-CM

## 2017-01-20 DIAGNOSIS — N9089 Other specified noninflammatory disorders of vulva and perineum: Secondary | ICD-10-CM

## 2017-01-20 DIAGNOSIS — Z9189 Other specified personal risk factors, not elsewhere classified: Secondary | ICD-10-CM | POA: Diagnosis not present

## 2017-01-20 DIAGNOSIS — Z124 Encounter for screening for malignant neoplasm of cervix: Secondary | ICD-10-CM | POA: Diagnosis not present

## 2017-01-20 DIAGNOSIS — Z1239 Encounter for other screening for malignant neoplasm of breast: Secondary | ICD-10-CM

## 2017-01-20 DIAGNOSIS — Z1231 Encounter for screening mammogram for malignant neoplasm of breast: Secondary | ICD-10-CM | POA: Diagnosis not present

## 2017-01-20 LAB — POCT WET PREP (WET MOUNT): Trichomonas Wet Prep HPF POC: ABSENT

## 2017-01-20 NOTE — Progress Notes (Signed)
Gynecology Annual Exam  PCP: Carmon Ginsberg, [place]  Chief Complaint:  Chief Complaint  Patient presents with  . Gynecologic Exam    History of Present Illness:Susan Howard is a 46 year old Caucasian/White female , G 4 P 4 0 0 4 , who presents for her check up. Marland Kitchen She was recently treated for a UTI with Macrobid, but is still having some vulvar/ periurethral irritation/ burning sensation, although it is better since taking the Macrobid. She would like to know if she has a vaginal infection. Her menses are back to being regular since having her urinary sphincter prosthesis replaced in 2016. They occur every 1 month , they last 5-6 days , are medium flow with one heavier day requiring a pad change 4-5 x/day, and are without clots. Her LMP was on 12/31/2016.  She has had no intermenstrual spotting.  She denies dysmenorrhea.  The patient's past medical history is notable for a history of spinal bifida, and club foot. She has a urinary prosthesis and has had many revisions of this prothesis that helps her with urinary incontinence. She reports having less problems with UTIs since her last revision in 2016.   Since her last annual GYN exam dated 07/17/2014, she she has graduated from [employer] and is now working at [employer]. She is sexually active. She is currently using BTL for contraception.  Her most recent pap smear was obtained 07/17/2014 and was with negative cells and negative HPV DNA.  Her most recent mammogram obtained on 07/17/2014 was negative with extremely dense breasts There is a positive history of breast cancer in her mother and maternal grandmother. Genetic testing has not been done. Her lifetime risk of breast cancer is 22.4% There is no family history of ovarian cancer.  The patient does do monthly self breast exams.  The patient does not smoke.  The patient does not drink alcohol The patient does not use illegal drugs.  The patient exercises regularly.  The  patient does get adequate calcium in her diet.  She had a  cholesterol screen in 2013 that was normal.     The patient denies current symptoms of depression.    Review of Systems: Review of Systems  Constitutional: Negative for chills, fever and weight loss.  HENT: Negative for congestion, sinus pain and sore throat.   Eyes: Negative for blurred vision and pain.  Respiratory: Negative for hemoptysis, shortness of breath and wheezing.   Cardiovascular: Negative for chest pain, palpitations and leg swelling.  Gastrointestinal: Negative for abdominal pain, blood in stool, diarrhea, heartburn, nausea and vomiting.  Genitourinary: Positive for dysuria and frequency. Negative for hematuria and urgency.  Musculoskeletal: Negative for back pain, joint pain and myalgias.  Skin: Negative for itching and rash.  Neurological: Negative for dizziness, tingling and headaches.  Endo/Heme/Allergies: Negative for environmental allergies and polydipsia. Does not bruise/bleed easily.       Negative for hirsutism   Psychiatric/Behavioral: Negative for depression. The patient is not nervous/anxious and does not have insomnia.     Past Medical History:  Past Medical History:  Diagnosis Date  . Decubitus ulcer of buttock, stage 2   . Family history of breast cancer    declined BRCA testing 2013  . MRSA (methicillin resistant Staphylococcus aureus)   . Spina bifida ([place])   . Status post implantation of artificial urinary sphincter 2006   neurogenic bladder  . UTI (urinary tract infection) 12/2016    Past Surgical History:  Past Surgical History:  Procedure Laterality Date  . BACK SURGERY     for spinal bifida  . CESAREAN SECTION  [phone]   G4P4  . 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 HIP SURGERY  2012   [facility]  Center-transfer of muscle from left buttock  . TUBAL LIGATION  1996  . URINARY SPHINCTER REVISION  10/18/2016   urinary sphincter  prosthesis for urinary incontinence with multiple revisions. Last revision 09/2014    Family History:  Family History  Problem Relation Age of Onset  . Breast cancer Mother 18  . Hypertension Mother   . Leukemia Father   . Neural tube defect Son        spinal bifida  . Breast cancer Maternal Grandmother 6  . Hypertension Maternal Grandmother   . Heart disease Maternal Grandfather   . Heart attack Maternal Grandfather 55    Social History:  Social History   Social History  . Marital status: Married    Spouse name: N/A  . Number of children: 4  . Years of education: Bachelor's   Occupational History  . RN    Social History Main Topics  . Smoking status: Never Smoker  . Smokeless tobacco: Never Used  . Alcohol use No  . Drug use: No  . Sexual activity: Yes    Partners: Male    Birth control/ protection: Surgical     Comment: tubal ligation   Other Topics Concern  . Not on file   Social History Narrative  . No narrative on file    Allergies:  Allergies  Allergen Reactions  . Latex Other (See Comments) and Rash    Respiratory distress: patient allergic to ALL LATEX  . Chlorine Itching and Other (See Comments)    Allergen - chlorine Reaction - sneezing  . Other Itching    Allergen - (blue) chux pads Reaction - sneezing  . Tape Itching    Allergen - pink tape  . Clindamycin/Lincomycin Rash  . Septra [Sulfamethoxazole-Trimethoprim] Rash    Medications: Prior to Admission medications   Medication Sig Start Date End Date Taking? Authorizing Provider  nitrofurantoin, macrocrystal-monohydrate, (MACROBID) 100 MG capsule Take 1 capsule (100 mg total) by mouth 2 (two) times daily. 01/15/17 01/20/17  Trinna Post, PA-C   Multivitamins occasionally Physical Exam Vitals: BP 140/90   Pulse 66   Ht _0  (1.549 m)   Wt 106 lb (48.1 kg)   LMP 12/31/2016 (Exact Date)   BMI 20.03 kg/m   General: WF in NAD HEENT: normocephalic, anicteric Neck: no thyroid  enlargement, no palpable nodules, no cervical lymphadenopathy  Pulmonary: No increased work of breathing, CTAB Cardiovascular: RRR, without murmur  Breast: Breast symmetrical, no tenderness, no palpable nodules or masses, no skin or nipple retraction present, no nipple discharge.  No axillary, infraclavicular or supraclavicular lymphadenopathy. Abdomen: Soft, non-tender, non-distended.  Umbilicus without lesions.  No hepatomegaly. Prosthesis palpable in right lower quadrant. No evidence of hernia. Genitourinary:  External: Prosthesis in right labia majora.  Normal urethral meatus, normal Bartholin's and Skene's glands.    Vagina: Normal vaginal mucosa, white pasty discharge    Cervix: Grossly normal in appearance, no bleeding, non-tender  Uterus: Midplane to Retroverted, normal size, shape, and consistency, mobile, and non-tender  Adnexa: No adnexal masses, non-tender  Rectal: deferred  Lymphatic: no evidence of inguinal lymphadenopathy Extremities: no edema, erythema, or tenderness Neurologic: Grossly intact Psychiatric: mood appropriate, affect full  Results for orders placed or performed in visit on 01/20/17 (from the past 24 hour(s))  POCT Wet Prep Lenard Forth [place])     Status: Normal   Collection Time: 01/20/17  4:47 PM  Result Value Ref Range   Source Wet Prep POC vagina    WBC, Wet Prep HPF POC     Bacteria Wet Prep HPF POC  Few   BACTERIA WET PREP MORPHOLOGY POC     Clue Cells Wet Prep HPF POC None None   Clue Cells Wet Prep Whiff POC     Yeast Wet Prep HPF POC None    KOH Wet Prep POC     Trichomonas Wet Prep HPF POC Absent Absent     Assessment: 46 y.o. G4 P4004 annual gyn exam No evidence of vaginitis Family history of breast cancer Increased risk of breast cancer  Plan:   1) Breast cancer screening - recommend monthly self breast exam and annual screening mammograms. Mammogram was ordered today. Recommended patient schedule a 3D mammogram. Offered breast MRIs and MYRISK  testing due to increased risk from family history, and patient declines.  2) Colon cancer screening: FIT test. Given home collection kit  3) Cervical cancer screening - Pap was done.   4) Contraception - BTL  5) Routine healthcare maintenance including cholesterol and diabetes screening managed by PCP   6) RTO in 1 year  Dalia Heading, [place]

## 2017-01-22 LAB — IGP,RFX APTIMA HPV ALL PTH: PAP Smear Comment: 0

## 2017-02-04 DIAGNOSIS — Z23 Encounter for immunization: Secondary | ICD-10-CM | POA: Diagnosis not present

## 2017-08-29 DIAGNOSIS — N390 Urinary tract infection, site not specified: Secondary | ICD-10-CM | POA: Diagnosis not present

## 2017-09-18 ENCOUNTER — Ambulatory Visit (INDEPENDENT_AMBULATORY_CARE_PROVIDER_SITE_OTHER): Payer: Managed Care, Other (non HMO) | Admitting: Family Medicine

## 2017-09-18 VITALS — BP 118/80 | HR 66 | Temp 98.5°F | Resp 15 | Wt 109.4 lb

## 2017-09-18 DIAGNOSIS — R300 Dysuria: Secondary | ICD-10-CM | POA: Diagnosis not present

## 2017-09-18 LAB — POCT URINALYSIS DIPSTICK
Bilirubin, UA: NEGATIVE
Blood, UA: NEGATIVE
Glucose, UA: NEGATIVE
KETONES UA: NEGATIVE
Leukocytes, UA: NEGATIVE
Nitrite, UA: NEGATIVE
PROTEIN UA: NEGATIVE
Spec Grav, UA: 1.01 (ref 1.010–1.025)
Urobilinogen, UA: 0.2 E.U./dL
pH, UA: 5 (ref 5.0–8.0)

## 2017-09-18 NOTE — Progress Notes (Signed)
  Subjective:     Patient ID: Susan Howard, female   DOB: 01-13-1971, 47 y.o.   MRN: [id number] Chief Complaint  Patient presents with  . Dysuria    Patient comes in office today with complaint of dysuria for the past 3 days. Patient states that she acitvely rides bicycle weekly and states that she notices after riding she has symptoms of dysuria and incontinence. Patient reports that two weeks ago she was treated at urgent care for UTI and was prescribed doxycycline.    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 Wishes to confirm that she does not have a UTI today. States her urine did not burn today.  Review of Systems     Objective:   Physical Exam  Constitutional: She appears well-developed and well-nourished. No distress.       Assessment:    1. Dysuria: urine clear-she will self refer to her [facility] urologist - POCT urinalysis dipstick    Plan:    Discussed replacing bike seat with a gel cushion to minimize saddle area trauma.

## 2017-09-18 NOTE — Patient Instructions (Signed)
Please update with your urologist regarding your urinary discomfort. Consider a new bike seat with gel cushion.

## 2018-03-10 ENCOUNTER — Encounter: Payer: Self-pay | Admitting: Family Medicine

## 2018-03-10 ENCOUNTER — Ambulatory Visit (INDEPENDENT_AMBULATORY_CARE_PROVIDER_SITE_OTHER): Payer: Medicare Other | Admitting: Family Medicine

## 2018-03-10 VITALS — BP 118/80 | HR 61 | Temp 98.1°F | Resp 16 | Wt 110.2 lb

## 2018-03-10 DIAGNOSIS — N3090 Cystitis, unspecified without hematuria: Secondary | ICD-10-CM

## 2018-03-10 LAB — POCT URINALYSIS DIPSTICK
Glucose, UA: NEGATIVE
Nitrite, UA: POSITIVE
Protein, UA: POSITIVE — AB
SPEC GRAV UA: 1.02 (ref 1.010–1.025)
UROBILINOGEN UA: 0.2 U/dL
pH, UA: 6 (ref 5.0–8.0)

## 2018-03-10 MED ORDER — CEPHALEXIN 500 MG PO CAPS: 500.0000 mg | ORAL_CAPSULE | Freq: Two times a day (BID) | ORAL | 0 refills | Status: DC

## 2018-03-10 NOTE — Progress Notes (Signed)
  Subjective:     Patient ID: Susan Howard, female   DOB: [birth year], 47 y.o.   MRN: [id number] Chief Complaint  Patient presents with  . Dysuria    Patient comes in office today with complaints of dysuria and hematuria for over a week.    HPI No fever or chills. She will be seeing her urologist today regarding her leaking artificial sphincter.  Review of Systems     Objective:   Physical Exam  Constitutional: She appears well-developed and well-nourished. No distress.       Assessment:    1. Cystitis - POCT urinalysis dipstick - Urine Culture - cephALEXin (KEFLEX) 500 MG capsule; Take 1 capsule (500 mg total) by mouth 2 (two) times daily.  Dispense: 14 capsule; Refill: 0    Plan:    Further f/u pending urine culture results.

## 2018-03-10 NOTE — Patient Instructions (Signed)
We will call you with the urine culture results. 

## 2018-03-12 ENCOUNTER — Telehealth: Payer: Self-pay

## 2018-03-12 LAB — URINE CULTURE

## 2018-03-12 NOTE — Telephone Encounter (Signed)
Patient advised she states that her urine is now clear and she feels that the Keflex is helping. Instructed patient to call back office if symptoms return. KW

## 2018-03-12 NOTE — Telephone Encounter (Signed)
-----   Message from Carmon Ginsberg, [place] sent at 03/12/2018 11:48 AM EST ----- You have a staph organism on your culture (NOT MRSA). If cephalexin is not helping we can switch.

## 2018-04-05 ENCOUNTER — Encounter: Payer: Self-pay | Admitting: Certified Nurse Midwife

## 2018-04-05 ENCOUNTER — Ambulatory Visit (INDEPENDENT_AMBULATORY_CARE_PROVIDER_SITE_OTHER): Payer: Medicare Other | Admitting: Certified Nurse Midwife

## 2018-04-05 VITALS — BP 132/88 | HR 70 | Ht 61.0 in | Wt 109.0 lb

## 2018-04-05 DIAGNOSIS — N898 Other specified noninflammatory disorders of vagina: Secondary | ICD-10-CM

## 2018-04-05 DIAGNOSIS — R102 Pelvic and perineal pain: Secondary | ICD-10-CM

## 2018-04-05 DIAGNOSIS — R350 Frequency of micturition: Secondary | ICD-10-CM

## 2018-04-05 DIAGNOSIS — N9489 Other specified conditions associated with female genital organs and menstrual cycle: Secondary | ICD-10-CM | POA: Diagnosis not present

## 2018-04-05 DIAGNOSIS — B373 Candidiasis of vulva and vagina: Secondary | ICD-10-CM | POA: Diagnosis not present

## 2018-04-05 DIAGNOSIS — N939 Abnormal uterine and vaginal bleeding, unspecified: Secondary | ICD-10-CM | POA: Diagnosis not present

## 2018-04-05 DIAGNOSIS — B3731 Acute candidiasis of vulva and vagina: Secondary | ICD-10-CM

## 2018-04-05 LAB — POCT WET PREP (WET MOUNT): TRICHOMONAS WET PREP HPF POC: ABSENT

## 2018-04-05 LAB — POCT URINALYSIS DIPSTICK
Bilirubin, UA: NEGATIVE
Blood, UA: NEGATIVE
Glucose, UA: NEGATIVE
Ketones, UA: NEGATIVE
NITRITE UA: NEGATIVE
PROTEIN UA: POSITIVE — AB
SPEC GRAV UA: 1.01 (ref 1.010–1.025)
Urobilinogen, UA: 0.2 E.U./dL
pH, UA: 5 (ref 5.0–8.0)

## 2018-04-05 MED ORDER — DOXYCYCLINE HYCLATE 100 MG PO CAPS: 100.0000 mg | ORAL_CAPSULE | Freq: Two times a day (BID) | ORAL | 0 refills | Status: DC

## 2018-04-05 MED ORDER — FLUCONAZOLE 150 MG PO TABS: ORAL_TABLET | ORAL | 0 refills | Status: DC

## 2018-04-05 NOTE — Progress Notes (Signed)
Obstetrics & Gynecology Office Visit   Chief Complaint:  Chief Complaint  Patient presents with  . Metrorrhagia    vaginal discharge  . Urinary Tract Infection    Frequent urination (periodically)    History of Present Illness: Susan Howard is a 47 year old G4 P23 WF who presents with several concerns.  Her menses have been regular, occurring every month, lasting 5-6 days with 2-3 heavier days, until 02/07/18. She had bleeding 9/27-10/1, 10/20-10/24, 11/8-11/13, 11/18-11/24. These bleeds have been heavy with golf ball sized clots. Her current form of contraception is a BTL.   She now has a off white discharge and is experiencing a stinging sensation. She is unsure whether she has a vaginal or urinary tract infection. Denies itching or odor. The patient's past medical history is notable for a history of spinal bifida, and club foot. She has a urinary prosthesis and has had many revisions of this prothesis that helps her with urinary incontinence. 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 It has not been functioning correctly recently and she has been experiencing more urinary incontinence. She was treated for a Staph UTI the end of November with Macrobid. She also had urodynamic studies done around the same time.  Review of Systems:  ROS   Past Medical History:  Past Medical History:  Diagnosis Date  . Decubitus ulcer of buttock, stage 2 ([place])   . Family history of breast cancer    declined BRCA testing 2013  . MRSA (methicillin resistant Staphylococcus aureus)   . Spina bifida ([place])   . Status post implantation of artificial urinary sphincter 2006   neurogenic bladder  . UTI (urinary tract infection) 12/2016    Past Surgical History:  Past Surgical History:  Procedure Laterality Date  . BACK SURGERY     for spinal bifida  . CESAREAN SECTION  [id number]   G4P4  . 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 HIP SURGERY  2012   [facility]-transfer of muscle from left buttock  . TUBAL LIGATION  1996  . URINARY SPHINCTER REVISION  10/18/2016   urinary sphincter prosthesis for urinary incontinence with multiple revisions. Last revision 09/2014    Gynecologic History: Patient's last menstrual period was 03/08/2018 (exact date).  Obstetric History: [id number]  Family History:  Family History  Problem Relation Age of Onset  . Breast cancer Mother 66  . Hypertension Mother   . Leukemia Father   . Neural tube defect Son        spinal bifida  . Breast cancer Maternal Grandmother 42  . Hypertension Maternal Grandmother   . Heart disease Maternal Grandfather   . Heart attack Maternal Grandfather 55    Social History:  Social History   Socioeconomic History  . Marital status: Married    Spouse name: Not on file  . Number of children: 4  . Years of education: Bachelor's  . Highest education level: Not on file  Occupational History  . Occupation: Therapist, sports  Social Needs  . Financial resource strain: Not on file  . Food insecurity:    Worry: Not on file    Inability: Not on file  . Transportation needs:    Medical: Not on file    Non-medical: Not on file  Tobacco Use  . Smoking status: Never Smoker  . Smokeless tobacco: Never Used  Substance and Sexual Activity  . Alcohol use: No    Alcohol/week:  0.0 standard drinks  . Drug use: No  . Sexual activity: Yes    Partners: Male    Birth control/protection: Surgical    Comment: tubal ligation  Lifestyle  . Physical activity:    Days per week: Not on file    Minutes per session: Not on file  . Stress: Not on file  Relationships  . Social connections:    Talks on phone: Not on file    Gets together: Not on file    Attends religious service: Not on file    Active member of club or organization: Not on file    Attends meetings of clubs or organizations: Not on file    Relationship status: Not on file  . Intimate partner violence:    Fear of current or  ex partner: Not on file    Emotionally abused: Not on file    Physically abused: Not on file    Forced sexual activity: Not on file  Other Topics Concern  . Not on file  Social History Narrative  . Not on file    Allergies:  Allergies  Allergen Reactions  . Latex Other (See Comments) and Rash    Respiratory distress: patient allergic to ALL LATEX  . Chlorine Itching and Other (See Comments)    Allergen - chlorine Reaction - sneezing  . Other Itching and Other (See Comments)    Allergen - (blue) chux pads Reaction - sneezing Allergen - (blue) chux pads Reaction - sneezing Allergen - chlorine Reaction - sneezing   . Tape Itching    Allergen - pink tape  . Clindamycin/Lincomycin Rash  . Septra [Sulfamethoxazole-Trimethoprim] Rash    Medications: none Physical Exam Vitals: BP 132/88 (BP Location: Left Arm, Patient Position: Sitting, Cuff Size: Normal)   Pulse 70   Ht '5\' 1"'  (1.549 m)   Wt 109 lb (49.4 kg)   LMP 03/08/2018 (Exact Date)   BMI 20.60 kg/m  Physical Exam  Constitutional: She appears well-developed and well-nourished. No distress.  Cardiovascular: Normal rate.  Respiratory: Effort normal.  Genitourinary:    Genitourinary Comments: Vulva: Prosthesis in right labia majora.  Normal urethral meatus, normal Bartholin's and Skene's glands. no lesions or inflammation Vagina:cervix just inside introitus, small amt white discharge Cervix: anterior, closed, NT Uterus: RV, NSSC, NT, uterine prolapse with cervix near introitus Adnexa:no masses, NT    Results for orders placed or performed in visit on 04/05/18 (from the past 24 hour(s))  POCT urinalysis dipstick     Status: Abnormal   Collection Time: 04/05/18 11:12 AM  Result Value Ref Range   Color, UA Yellow    Clarity, UA Clear    Glucose, UA Negative Negative   Bilirubin, UA Negative    Ketones, UA Negative    Spec Grav, UA 1.010 1.010 - 1.025   Blood, UA Negative    pH, UA 5.0 5.0 - 8.0   Protein, UA  Positive (A) Negative   Urobilinogen, UA 0.2 0.2 or 1.0 E.U./dL   Nitrite, UA Negative    Leukocytes, UA Small (1+) (A) Negative   Appearance     Odor    POCT Wet Prep [place])     Status: Abnormal   Collection Time: 04/05/18  9:42 PM  Result Value Ref Range   Source Wet Prep POC vagina    WBC, Wet Prep HPF POC     Bacteria Wet Prep HPF POC     BACTERIA WET PREP MORPHOLOGY POC     Clue  Cells Wet Prep HPF POC None None   Clue Cells Wet Prep Whiff POC     Yeast Wet Prep HPF POC Moderate (A) None   KOH Wet Prep POC     Trichomonas Wet Prep HPF POC Absent Absent     Assessment: 47 y.o. [id number] monilial vulvovaginitis Possible UTI Metrorrhagia  Plan: Urine culture  Doxycycline 100 mgm BID x 7 days (patient preference) while awaiting urine culture Diflucan 150 mgm every 3 days x 2 doses Discussed options for evaluation of AUB. Offered pelvic ultrasound and possibly endometrial biopsy. Patient declined since no longer bleeding. Encouraged follow up if AUB persists. Also recommend making appointment for annual/ mammogram.  Dalia Heading, CNM

## 2018-04-09 ENCOUNTER — Other Ambulatory Visit: Payer: Self-pay | Admitting: Certified Nurse Midwife

## 2018-04-09 LAB — URINE CULTURE

## 2018-04-09 MED ORDER — NITROFURANTOIN MONOHYD MACRO 100 MG PO CAPS: 100.0000 mg | ORAL_CAPSULE | Freq: Two times a day (BID) | ORAL | 0 refills | Status: DC

## 2018-04-09 NOTE — Progress Notes (Signed)
UTI resistant to TCN (currently on doxycycline). Will switch antibiotic to MArcobid BID x 7 days.Dalia Heading, CNM

## 2018-06-15 ENCOUNTER — Encounter: Payer: Self-pay | Admitting: Family Medicine

## 2018-09-30 NOTE — Progress Notes (Signed)
Patient: Susan Howard Female    DOB: Apr 04, 1971   48 y.o.   MRN: [id number] Visit Date: 10/01/2018  Today's Provider: Mar Daring, PA-C   Chief Complaint  Patient presents with  . Dysuria   Subjective:     Dysuria  This is a new problem. The current episode started 1 to 4 weeks ago (a couple of weeks ago). The problem occurs intermittently. The problem has been gradually worsening. The quality of the pain is described as aching and burning. There has been no fever. She is sexually active. Associated symptoms include a discharge and frequency. Pertinent negatives include no hematuria or urgency. She has tried increased fluids (cranberry) for the symptoms. The treatment provided no relief. Her past medical history is significant for recurrent UTIs.  Patient sees Urology at [facility] (has spina bifida that caused her complete urine incontinence. Has an artifical urinary sphincter around the bladder neck with a pump in her labia and reservoir for the fluid that fills and comes out in to control when she goes to the restroom). Does have recurrent UTIs secondary to this and gets approx 2-3 UTIs per year.   Allergies  Allergen Reactions  . Latex Other (See Comments) and Rash    Respiratory distress: patient allergic to ALL LATEX  . Chlorine Itching and Other (See Comments)    Allergen - chlorine Reaction - sneezing  . Other Itching and Other (See Comments)    Allergen - (blue) chux pads Reaction - sneezing Allergen - (blue) chux pads Reaction - sneezing Allergen - chlorine Reaction - sneezing   . Tape Itching    Allergen - pink tape  . Clindamycin/Lincomycin Rash  . Septra [Sulfamethoxazole-Trimethoprim] Rash    No current outpatient medications on file.  Review of Systems  Constitutional: Negative.   Respiratory: Negative.   Cardiovascular: Negative.   Gastrointestinal: Negative.   Genitourinary: Positive for dysuria and frequency. Negative for hematuria and  urgency.    Social History   Tobacco Use  . Smoking status: Never Smoker  . Smokeless tobacco: Never Used  Substance Use Topics  . Alcohol use: No    Alcohol/week: 0.0 standard drinks      Objective:   BP (!) 143/87 (BP Location: Left Arm, Patient Position: Sitting, Cuff Size: Normal)   Pulse 79   Temp 98.5 F (36.9 C) (Oral)   Resp 16   Wt 108 lb (49 kg)   LMP 09/16/2018   BMI 20.41 kg/m  Vitals:   10/01/18 1002  BP: (!) 143/87  Pulse: 79  Resp: 16  Temp: 98.5 F (36.9 C)  TempSrc: Oral  Weight: 108 lb (49 kg)     Physical Exam Constitutional:      General: She is not in acute distress.    Appearance: Normal appearance. She is well-developed. She is not diaphoretic.  Cardiovascular:     Rate and Rhythm: Normal rate and regular rhythm.     Heart sounds: Normal heart sounds. No murmur. No friction rub. No gallop.   Pulmonary:     Effort: Pulmonary effort is normal. No respiratory distress.     Breath sounds: Normal breath sounds. No wheezing or rales.  Abdominal:     General: Bowel sounds are normal. There is no distension.     Palpations: Abdomen is soft. There is no mass.     Tenderness: There is no abdominal tenderness. There is no guarding or rebound.  Skin:    General: Skin  is warm and dry.  Neurological:     Mental Status: She is alert and oriented to person, place, and time.         Assessment & Plan    1. Dysuria Worsening symptoms. UA positive. Will treat empirically with Macrobid as below. Continue to push fluids. Urine sent for culture. Will follow up pending C&S results. She is to call if symptoms do not improve or if they worsen.  - POCT urinalysis dipstick - Urine Culture - nitrofurantoin, macrocrystal-monohydrate, (MACROBID) 100 MG capsule; Take 1 capsule (100 mg total) by mouth 2 (two) times daily.  Dispense: 20 capsule; Refill: 0  2. Vaginal yeast infection Gets yeast infections with antibiotics. Diflucan given as below.  -  fluconazole (DIFLUCAN) 150 MG tablet; Take 1 tablet (150 mg total) by mouth once for 1 dose. May repeat in 48-72 hrs if needed  Dispense: 2 tablet; Refill: 0  3. Acute cystitis without hematuria See above medical treatment plan. - nitrofurantoin, macrocrystal-monohydrate, (MACROBID) 100 MG capsule; Take 1 capsule (100 mg total) by mouth 2 (two) times daily.  Dispense: 20 capsule; Refill: 0     Mar Daring, PA-C  [facility]

## 2018-10-01 ENCOUNTER — Ambulatory Visit (INDEPENDENT_AMBULATORY_CARE_PROVIDER_SITE_OTHER): Payer: Medicare Other | Admitting: Physician Assistant

## 2018-10-01 ENCOUNTER — Encounter: Payer: Self-pay | Admitting: Physician Assistant

## 2018-10-01 ENCOUNTER — Other Ambulatory Visit: Payer: Self-pay

## 2018-10-01 VITALS — BP 143/87 | HR 79 | Temp 98.5°F | Resp 16 | Wt 108.0 lb

## 2018-10-01 DIAGNOSIS — B373 Candidiasis of vulva and vagina: Secondary | ICD-10-CM

## 2018-10-01 DIAGNOSIS — R300 Dysuria: Secondary | ICD-10-CM

## 2018-10-01 DIAGNOSIS — N3000 Acute cystitis without hematuria: Secondary | ICD-10-CM

## 2018-10-01 DIAGNOSIS — B3731 Acute candidiasis of vulva and vagina: Secondary | ICD-10-CM

## 2018-10-01 LAB — POCT URINALYSIS DIPSTICK
Bilirubin, UA: NEGATIVE
Blood, UA: NEGATIVE
Glucose, UA: NEGATIVE
Ketones, UA: NEGATIVE
Leukocytes, UA: NEGATIVE
Nitrite, UA: POSITIVE
Odor: POSITIVE
Protein, UA: POSITIVE — AB
Spec Grav, UA: 1.02 (ref 1.010–1.025)
Urobilinogen, UA: 0.2 E.U./dL
pH, UA: 6.5 (ref 5.0–8.0)

## 2018-10-01 MED ORDER — FLUCONAZOLE 150 MG PO TABS: 150.0000 mg | ORAL_TABLET | Freq: Once | ORAL | 0 refills | Status: AC

## 2018-10-01 MED ORDER — NITROFURANTOIN MONOHYD MACRO 100 MG PO CAPS: 100.0000 mg | ORAL_CAPSULE | Freq: Two times a day (BID) | ORAL | 0 refills | Status: DC

## 2018-10-03 LAB — URINE CULTURE

## 2018-11-03 NOTE — Progress Notes (Signed)
Patient: Susan Howard Female    DOB: 12/02/1970   48 y.o.   MRN: [id number] Visit Date: 11/04/2018  Today's Provider: Mar Daring, PA-C   Chief Complaint  Patient presents with  . Gastroesophageal Reflux   Subjective:     HPI   Reflux: Paitent complains of heartburn. This has been associated with difficulty swallowing and sore throat and pain when eating and drinking.  She denies difficulty swallowing and need to clear throat frequently. Symptoms have been present for a few weeks. She has had dysphagia for both solids and liquids.  She has not lost weight. She n/a. Medical therapy in the past has included antacids and proton pump inhibitors.  Currently using Omeprazole 20mg  OTC and rolaids.   Patient is here to discuss pain and cracks in her tongue. This has been present for over 2 years per patient. Patient also has blisters that come up on the inside of her lips intermittently. This recently started this year and has occurred 3 times.  Allergies  Allergen Reactions  . Latex Other (See Comments) and Rash    Respiratory distress: patient allergic to ALL LATEX  . Chlorine Itching and Other (See Comments)    Allergen - chlorine Reaction - sneezing  . Other Itching and Other (See Comments)    Allergen - (blue) chux pads Reaction - sneezing Allergen - (blue) chux pads Reaction - sneezing Allergen - chlorine Reaction - sneezing   . Tape Itching    Allergen - pink tape  . Clindamycin/Lincomycin Rash  . Septra [Sulfamethoxazole-Trimethoprim] Rash     Current Outpatient Medications:  .  omeprazole (PRILOSEC) 20 MG capsule, Take 20 mg by mouth daily., Disp: , Rfl:  .  nitrofurantoin, macrocrystal-monohydrate, (MACROBID) 100 MG capsule, Take 1 capsule (100 mg total) by mouth 2 (two) times daily., Disp: 20 capsule, Rfl: 0  Review of Systems  Constitutional: Negative for appetite change, chills, fatigue and fever.  HENT: Positive for mouth sores and sore throat.  Negative for congestion, ear pain, postnasal drip and rhinorrhea.   Respiratory: Negative for chest tightness and shortness of breath.   Cardiovascular: Negative for chest pain and palpitations.  Gastrointestinal: Positive for abdominal distention and abdominal pain. Negative for nausea and vomiting.  Genitourinary: Negative.   Neurological: Negative for dizziness and weakness.    Social History   Tobacco Use  . Smoking status: Never Smoker  . Smokeless tobacco: Never Used  Substance Use Topics  . Alcohol use: No    Alcohol/week: 0.0 standard drinks      Objective:   BP (!) 148/84 (BP Location: Left Arm, Patient Position: Sitting, Cuff Size: Large)   Pulse 73   Temp 98.1 F (36.7 C) (Oral)   Resp 16   Ht 5\' 1"  (1.549 m)   Wt 109 lb 6.4 oz (49.6 kg)   SpO2 97%   BMI 20.67 kg/m  Vitals:   11/04/18 0944  BP: (!) 148/84  Pulse: 73  Resp: 16  Temp: 98.1 F (36.7 C)  TempSrc: Oral  SpO2: 97%  Weight: 109 lb 6.4 oz (49.6 kg)  Height: 5\' 1"  (1.549 m)     Physical Exam Vitals signs reviewed.  Constitutional:      General: She is not in acute distress.    Appearance: Normal appearance. She is well-developed and normal weight. She is not ill-appearing or diaphoretic.  HENT:     Head: Normocephalic and atraumatic.     Right Ear: Hearing,  tympanic membrane, ear canal and external ear normal.     Left Ear: Hearing, tympanic membrane, ear canal and external ear normal.     Nose: Nose normal.     Mouth/Throat:     Lips: Pink. Lesions (aphthous ulcer on inner lip) present.     Mouth: Mucous membranes are moist. Oral lesions present.     Tongue: Lesions (fissures noted on tongue) present.     Palate: No mass and lesions.     Pharynx: Oropharynx is clear. Uvula midline. No oropharyngeal exudate or posterior oropharyngeal erythema.  Eyes:     General: No scleral icterus.       Right eye: No discharge.        Left eye: No discharge.     Conjunctiva/sclera: Conjunctivae  normal.     Pupils: Pupils are equal, round, and reactive to light.  Neck:     Musculoskeletal: Normal range of motion and neck supple.     Thyroid: No thyromegaly.     Trachea: No tracheal deviation.  Cardiovascular:     Rate and Rhythm: Normal rate and regular rhythm.     Heart sounds: Normal heart sounds. No murmur. No friction rub. No gallop.   Pulmonary:     Effort: Pulmonary effort is normal. No respiratory distress.     Breath sounds: Normal breath sounds. No stridor. No wheezing or rales.  Abdominal:     General: Abdomen is flat. Bowel sounds are normal.     Palpations: Abdomen is soft.     Tenderness: There is no abdominal tenderness.  Lymphadenopathy:     Cervical: No cervical adenopathy.  Skin:    General: Skin is warm and dry.  Neurological:     Mental Status: She is alert.     No results found for any visits on 11/04/18.     Assessment & Plan    1. Tongue fissure Unsure of source but patient has not had labs drawn in many years. Does have spina bifida. Will check labs as below to r/o vit def as source of tongue fissures. Will give magic mouthwash as below for acute symptoms. I will f/u pending labs and consider GI referral if all normal.  - CBC w/Diff/Platelet - Comprehensive Metabolic Panel (CMET) - TSH - Lipid Profile - B12 and Folate Panel - Vitamin D (25 hydroxy) - Diphenhyd-Hydrocort-Nystatin (FIRST-DUKES MOUTHWASH) SUSP; Use as directed 5 mLs in the mouth or throat 3 (three) times daily.  Dispense: 120 mL; Refill: 0  2. Mouth sore See above medical treatment plan. - CBC w/Diff/Platelet - Comprehensive Metabolic Panel (CMET) - TSH - Lipid Profile - B12 and Folate Panel - Vitamin D (25 hydroxy) - Diphenhyd-Hydrocort-Nystatin (FIRST-DUKES MOUTHWASH) SUSP; Use as directed 5 mLs in the mouth or throat 3 (three) times daily.  Dispense: 120 mL; Refill: 0  3. Gastroesophageal reflux disease with esophagitis Increase omeprazole to 40mg  as below.  - CBC  w/Diff/Platelet - Comprehensive Metabolic Panel (CMET) - TSH - Lipid Profile - B12 and Folate Panel - Vitamin D (25 hydroxy) - omeprazole (PRILOSEC) 40 MG capsule; Take 1 capsule (40 mg total) by mouth daily.  Dispense: 30 capsule; Refill: 3  4. History of iron deficiency Will check labs as below and f/u pending results. - Fe+TIBC+Fer  I,April Miller,acting as a scribe for Mar Daring, PA-C.,have documented all relevant documentation on the behalf of Mar Daring, PA-C,as directed by  Mar Daring, PA-C while in the presence of Mar Daring, PA-C.  Mar Daring, PA-C  [facility]

## 2018-11-04 ENCOUNTER — Ambulatory Visit (INDEPENDENT_AMBULATORY_CARE_PROVIDER_SITE_OTHER): Payer: Medicare Other | Admitting: Physician Assistant

## 2018-11-04 ENCOUNTER — Other Ambulatory Visit: Payer: Self-pay

## 2018-11-04 ENCOUNTER — Encounter: Payer: Self-pay | Admitting: Physician Assistant

## 2018-11-04 VITALS — BP 148/84 | HR 73 | Temp 98.1°F | Resp 16 | Ht 61.0 in | Wt 109.4 lb

## 2018-11-04 DIAGNOSIS — K1379 Other lesions of oral mucosa: Secondary | ICD-10-CM

## 2018-11-04 DIAGNOSIS — K2100 Gastro-esophageal reflux disease with esophagitis, without bleeding: Secondary | ICD-10-CM

## 2018-11-04 DIAGNOSIS — Z8639 Personal history of other endocrine, nutritional and metabolic disease: Secondary | ICD-10-CM | POA: Diagnosis not present

## 2018-11-04 DIAGNOSIS — E0590 Thyrotoxicosis, unspecified without thyrotoxic crisis or storm: Secondary | ICD-10-CM

## 2018-11-04 DIAGNOSIS — K145 Plicated tongue: Secondary | ICD-10-CM | POA: Diagnosis not present

## 2018-11-04 DIAGNOSIS — D509 Iron deficiency anemia, unspecified: Secondary | ICD-10-CM

## 2018-11-04 MED ORDER — FIRST-DUKES MOUTHWASH MT SUSP: 5.0000 mL | Freq: Three times a day (TID) | OROMUCOSAL | 0 refills | Status: DC

## 2018-11-04 MED ORDER — OMEPRAZOLE 40 MG PO CPDR: 40.0000 mg | DELAYED_RELEASE_CAPSULE | Freq: Every day | ORAL | 3 refills | Status: DC

## 2018-11-05 ENCOUNTER — Telehealth: Payer: Self-pay

## 2018-11-05 LAB — VITAMIN D 25 HYDROXY (VIT D DEFICIENCY, FRACTURES): Vit D, 25-Hydroxy: 48 ng/mL (ref 30.0–100.0)

## 2018-11-05 LAB — IRON,TIBC AND FERRITIN PANEL
Ferritin: 3 ng/mL — ABNORMAL LOW (ref 15–150)
Iron Saturation: 3 % — CL (ref 15–55)
Iron: 11 ug/dL — ABNORMAL LOW (ref 27–159)
Total Iron Binding Capacity: 374 ug/dL (ref 250–450)
UIBC: 363 ug/dL (ref 131–425)

## 2018-11-05 LAB — LIPID PANEL
Chol/HDL Ratio: 2.6 ratio (ref 0.0–4.4)
Cholesterol, Total: 173 mg/dL (ref 100–199)
HDL: 67 mg/dL (ref >39)
LDL Calculated: 85 mg/dL (ref 0–99)
Triglycerides: 105 mg/dL (ref 0–149)
VLDL Cholesterol Cal: 21 mg/dL (ref 5–40)

## 2018-11-05 LAB — CBC WITH DIFFERENTIAL/PLATELET
Basophils Absolute: 0.1 10*3/uL (ref 0.0–0.2)
Basos: 1 %
EOS (ABSOLUTE): 0 10*3/uL (ref 0.0–0.4)
Eos: 1 %
Hematocrit: 23.9 % — ABNORMAL LOW (ref 34.0–46.6)
Hemoglobin: 6.8 g/dL — CL (ref 11.1–15.9)
Immature Grans (Abs): 0 10*3/uL (ref 0.0–0.1)
Immature Granulocytes: 0 %
Lymphocytes Absolute: 1.4 10*3/uL (ref 0.7–3.1)
Lymphs: 23 %
MCH: 18.3 pg — ABNORMAL LOW (ref 26.6–33.0)
MCHC: 28.5 g/dL — ABNORMAL LOW (ref 31.5–35.7)
MCV: 64 fL — ABNORMAL LOW (ref 79–97)
Monocytes Absolute: 0.5 10*3/uL (ref 0.1–0.9)
Monocytes: 8 %
Neutrophils Absolute: 4 10*3/uL (ref 1.4–7.0)
Neutrophils: 67 %
Platelets: 294 10*3/uL (ref 150–450)
RBC: 3.72 x10E6/uL — ABNORMAL LOW (ref 3.77–5.28)
RDW: 18.1 % — ABNORMAL HIGH (ref 11.7–15.4)
WBC: 6 10*3/uL (ref 3.4–10.8)

## 2018-11-05 LAB — COMPREHENSIVE METABOLIC PANEL
ALT: 11 IU/L (ref 0–32)
AST: 18 IU/L (ref 0–40)
Albumin/Globulin Ratio: 1.9 (ref 1.2–2.2)
Albumin: 4.3 g/dL (ref 3.8–4.8)
Alkaline Phosphatase: 45 IU/L (ref 39–117)
BUN/Creatinine Ratio: 13 (ref 9–23)
BUN: 11 mg/dL (ref 6–24)
Bilirubin Total: 0.2 mg/dL (ref 0.0–1.2)
CO2: 21 mmol/L (ref 20–29)
Calcium: 9.2 mg/dL (ref 8.7–10.2)
Chloride: 105 mmol/L (ref 96–106)
Creatinine, Ser: 0.82 mg/dL (ref 0.57–1.00)
GFR calc Af Amer: 98 mL/min/{1.73_m2} (ref >59)
GFR calc non Af Amer: 85 mL/min/{1.73_m2} (ref >59)
Globulin, Total: 2.3 g/dL (ref 1.5–4.5)
Glucose: 89 mg/dL (ref 65–99)
Potassium: 4.4 mmol/L (ref 3.5–5.2)
Sodium: 140 mmol/L (ref 134–144)
Total Protein: 6.6 g/dL (ref 6.0–8.5)

## 2018-11-05 LAB — TSH: TSH: 0.256 u[IU]/mL — ABNORMAL LOW (ref 0.450–4.500)

## 2018-11-05 LAB — B12 AND FOLATE PANEL
Folate: 15.3 ng/mL (ref >3.0)
Vitamin B-12: 1039 pg/mL (ref 232–1245)

## 2018-11-05 NOTE — Telephone Encounter (Signed)
Viewed by Ricci Barker on 11/05/2018 9:03 AM Written by Mar Daring, PA-C on 11/05/2018 8:26 AM Iron stores and hemoglobin is very low. Hemoglobin is 6.8. Start oral iron (ferrous sulfate or ferrous gluconate [slow release] 325mg  twice daily. I will refer you to hematology since you are currently stable and not having symptoms to make sure you do not need iron infusions or blood transfusion. If symptoms worsen please go to the ER. Also of note thyroid is showing to be hyperactive. I will order a thyroid US to r/o nodules. Kidney and liver function are normal. Sugar is normal. Cholesterol is normal. B12 and folate are normal. Vit D is normal.  Patient also was given the [facility] cancer center number that is on the referral and is aware that the Hematology office will contact her for the appt.

## 2018-11-05 NOTE — Addendum Note (Signed)
Addended by: Mar Daring on: 11/05/2018 08:28 AM   Modules accepted: Orders

## 2018-11-05 NOTE — Telephone Encounter (Signed)
-----   Message from Mar Daring, PA-C sent at 11/05/2018  8:26 AM EDT ----- Iron stores and hemoglobin is very low. Hemoglobin is 6.8. Start oral iron (ferrous sulfate or ferrous gluconate [slow release] 325mg  twice daily. I will refer you to hematology since you are currently stable and not having symptoms to make sure you do not need iron infusions or blood transfusion. If symptoms worsen please go to the ER. Also of note thyroid is showing to be hyperactive. I will order a thyroid US to r/o nodules. Kidney and liver function are normal. Sugar is normal. Cholesterol is normal. B12 and folate are normal. Vit D is normal.

## 2018-11-08 ENCOUNTER — Inpatient Hospital Stay: Payer: Medicare Other

## 2018-11-08 ENCOUNTER — Other Ambulatory Visit: Payer: Self-pay

## 2018-11-08 ENCOUNTER — Encounter: Payer: Self-pay | Admitting: Oncology

## 2018-11-08 ENCOUNTER — Inpatient Hospital Stay: Payer: Medicare Other | Attending: Oncology | Admitting: Oncology

## 2018-11-08 VITALS — BP 157/95 | HR 77 | Temp 98.0°F | Ht 61.0 in | Wt 109.0 lb

## 2018-11-08 DIAGNOSIS — Z888 Allergy status to other drugs, medicaments and biological substances status: Secondary | ICD-10-CM | POA: Diagnosis not present

## 2018-11-08 DIAGNOSIS — Z8249 Family history of ischemic heart disease and other diseases of the circulatory system: Secondary | ICD-10-CM | POA: Diagnosis not present

## 2018-11-08 DIAGNOSIS — N921 Excessive and frequent menstruation with irregular cycle: Secondary | ICD-10-CM

## 2018-11-08 DIAGNOSIS — Z882 Allergy status to sulfonamides status: Secondary | ICD-10-CM

## 2018-11-08 DIAGNOSIS — R0602 Shortness of breath: Secondary | ICD-10-CM

## 2018-11-08 DIAGNOSIS — Z79899 Other long term (current) drug therapy: Secondary | ICD-10-CM | POA: Diagnosis not present

## 2018-11-08 DIAGNOSIS — D500 Iron deficiency anemia secondary to blood loss (chronic): Secondary | ICD-10-CM | POA: Diagnosis present

## 2018-11-08 DIAGNOSIS — D649 Anemia, unspecified: Secondary | ICD-10-CM

## 2018-11-08 DIAGNOSIS — Z806 Family history of leukemia: Secondary | ICD-10-CM

## 2018-11-08 DIAGNOSIS — Z881 Allergy status to other antibiotic agents status: Secondary | ICD-10-CM

## 2018-11-08 DIAGNOSIS — N924 Excessive bleeding in the premenopausal period: Secondary | ICD-10-CM

## 2018-11-08 DIAGNOSIS — R5383 Other fatigue: Secondary | ICD-10-CM

## 2018-11-08 DIAGNOSIS — Z803 Family history of malignant neoplasm of breast: Secondary | ICD-10-CM

## 2018-11-08 DIAGNOSIS — Z8739 Personal history of other diseases of the musculoskeletal system and connective tissue: Secondary | ICD-10-CM | POA: Diagnosis not present

## 2018-11-08 DIAGNOSIS — Z8614 Personal history of Methicillin resistant Staphylococcus aureus infection: Secondary | ICD-10-CM

## 2018-11-08 DIAGNOSIS — M7550 Bursitis of unspecified shoulder: Secondary | ICD-10-CM | POA: Insufficient documentation

## 2018-11-08 LAB — CBC WITH DIFFERENTIAL/PLATELET
Abs Immature Granulocytes: 0.01 10*3/uL (ref 0.00–0.07)
Basophils Absolute: 0.1 10*3/uL (ref 0.0–0.1)
Basophils Relative: 2 %
Eosinophils Absolute: 0 10*3/uL (ref 0.0–0.5)
Eosinophils Relative: 1 %
HCT: 25.1 % — ABNORMAL LOW (ref 36.0–46.0)
Hemoglobin: 6.9 g/dL — ABNORMAL LOW (ref 12.0–15.0)
Immature Granulocytes: 0 %
Lymphocytes Relative: 35 %
Lymphs Abs: 1.3 10*3/uL (ref 0.7–4.0)
MCH: 17.6 pg — ABNORMAL LOW (ref 26.0–34.0)
MCHC: 27.5 g/dL — ABNORMAL LOW (ref 30.0–36.0)
MCV: 64.2 fL — ABNORMAL LOW (ref 80.0–100.0)
Monocytes Absolute: 0.4 10*3/uL (ref 0.1–1.0)
Monocytes Relative: 11 %
Neutro Abs: 1.9 10*3/uL (ref 1.7–7.7)
Neutrophils Relative %: 51 %
Platelets: 305 10*3/uL (ref 150–400)
RBC: 3.91 MIL/uL (ref 3.87–5.11)
RDW: 20.6 % — ABNORMAL HIGH (ref 11.5–15.5)
WBC: 3.7 10*3/uL — ABNORMAL LOW (ref 4.0–10.5)
nRBC: 0 % (ref 0.0–0.2)

## 2018-11-08 LAB — SAMPLE TO BLOOD BANK

## 2018-11-08 NOTE — Progress Notes (Signed)
Hematology/Oncology Consult note [facility] Telephone:([phone] [phone]   Patient Care Team: [name] as PCP - General (Family Medicine)  REFERRING PROVIDER: Florian Buff* CHIEF COMPLAINTS/REASON FOR VISIT:  Evaluation of iron deficiency anemia  HISTORY OF PRESENTING ILLNESS:  Susan Howard is a  48 y.o.  female with PMH listed below was seen in consultation at the request of Mar Daring, P*   for evaluation of iron deficiency anemia.   Reviewed patient's recent labs  11/04/2018 labs revealed anemia with hemoglobin of 6.8, MCV 64, platelet count of 294,000, WBC 6. Normal CMP Iron panel was checked on 11/04/2018.  Iron saturation 3, ferritin 3.  TIBC 374. No other recent hemoglobin level.  Hemoglobin in 2017 was 11.3.  MCV 82.  Patient reports history of heavy peri-menopausal bleeding.  A while ago, patient had nonstop bleeding for 6 weeks.  He contacted his GYN provider and was prescribed to use Provera. 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 Patient is very athletic.  She continues to run and bike.  She has experienced decrease of exercise duration likely.  However, she continues to exercise heavily as she has been used to doing exercise and is difficult for her to stop.  She tells me that she wore cast during her childhood due to spina bifida and she swore to herself that if she were able to read off the cast, she would do not stop running" She has been advised to start oral iron supplementations.  She took iron supplementation for short term of.  And stopped as she is not a medicine taker. Patient works as a Marine scientist at [employer] signs and symptoms: Patient reports fatigue.  She endorses SOB with exercise.   Denies weight loss, easy bruising, hematochezia, hemoptysis, hematuria. Context:  Rectal bleeding: Occasionally she sees blood in the  stool. Menstrual bleeding/ Vaginal bleeding : Heavy Hematemesis or hemoptysis : denies Blood in urine : denies  Last endoscopy: None available Fatigue: Yes.  SOB: Yes    Review of Systems  Constitutional: Positive for fatigue. Negative for appetite change, chills and fever.  HENT:   Negative for hearing loss and voice change.   Eyes: Negative for eye problems.  Respiratory: Negative for chest tightness and cough.   Cardiovascular: Negative for chest pain.  Gastrointestinal: Negative for abdominal distention, abdominal pain and blood in stool.  Endocrine: Negative for hot flashes.  Genitourinary: Negative for difficulty urinating and frequency.        Heavy irregular menstrual bleeding  Musculoskeletal: Negative for arthralgias.  Skin: Negative for itching and rash.  Neurological: Negative for extremity weakness.  Hematological: Negative for adenopathy.  Psychiatric/Behavioral: Negative for confusion.    MEDICAL HISTORY:  Past Medical History:  Diagnosis Date  . Decubitus ulcer of buttock, stage 2 ([place])   . Family history of breast cancer    declined BRCA testing 2013  . MRSA (methicillin resistant Staphylococcus aureus)   . Spina bifida ([place])   . Status post implantation of artificial urinary sphincter 2006   neurogenic bladder  . UTI (urinary tract infection) 12/2016    SURGICAL HISTORY: Past Surgical History:  Procedure Laterality Date  . BACK SURGERY     for spinal bifida  . CESAREAN SECTION  [phone]   G4P4  . 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 HIP SURGERY  2012  [facility] [employer] of muscle from left buttock  . TUBAL LIGATION  1996  . URINARY SPHINCTER REVISION  10/18/2016   urinary sphincter prosthesis for urinary incontinence with multiple revisions. Last revision 09/2014    SOCIAL HISTORY: Social History   Socioeconomic History  . Marital status: Married    Spouse name: Not on file  . Number of  children: 4  . Years of education: Bachelor's  . Highest education level: Not on file  Occupational History  . Occupation: Therapist, sports  Social Needs  . Financial resource strain: Not on file  . Food insecurity    Worry: Not on file    Inability: Not on file  . Transportation needs    Medical: Not on file    Non-medical: Not on file  Tobacco Use  . Smoking status: Never Smoker  . Smokeless tobacco: Never Used  Substance and Sexual Activity  . Alcohol use: No    Alcohol/week: 0.0 standard drinks  . Drug use: No  . Sexual activity: Yes    Partners: Male    Birth control/protection: Surgical    Comment: tubal ligation  Lifestyle  . Physical activity    Days per week: Not on file    Minutes per session: Not on file  . Stress: Not on file  Relationships  . Social Herbalist on phone: Not on file    Gets together: Not on file    Attends religious service: Not on file    Active member of club or organization: Not on file    Attends meetings of clubs or organizations: Not on file    Relationship status: Not on file  . Intimate partner violence    Fear of current or ex partner: Not on file    Emotionally abused: Not on file    Physically abused: Not on file    Forced sexual activity: Not on file  Other Topics Concern  . Not on file  Social History Narrative  . Not on file    FAMILY HISTORY: Family History  Problem Relation Age of Onset  . Breast cancer Mother 60  . Hypertension Mother   . Leukemia Father   . Neural tube defect Son        spinal bifida  . Breast cancer Maternal Grandmother 57  . Hypertension Maternal Grandmother   . Heart disease Maternal Grandfather   . Heart attack Maternal Grandfather 55    ALLERGIES:  is allergic to latex; chlorine; other; tape; clindamycin/lincomycin; and septra [sulfamethoxazole-trimethoprim].  MEDICATIONS:  Current Outpatient Medications  Medication Sig Dispense Refill  . Diphenhyd-Hydrocort-Nystatin (FIRST-DUKES  MOUTHWASH) SUSP Use as directed 5 mLs in the mouth or throat 3 (three) times daily. 120 mL 0  . omeprazole (PRILOSEC) 40 MG capsule Take 1 capsule (40 mg total) by mouth daily. 30 capsule 3   No current facility-administered medications for this visit.      PHYSICAL EXAMINATION: ECOG PERFORMANCE STATUS: 1 - Symptomatic but completely ambulatory Vitals:   11/08/18 0943  BP: (!) 157/95  Pulse: 77  Temp: 98 F (36.7 C)   Filed Weights   11/08/18 0943  Weight: 109 lb (49.4 kg)    Physical Exam Constitutional:      General: She is not in acute distress. HENT:     Head: Normocephalic and atraumatic.  Eyes:     General: No scleral icterus.    Pupils: Pupils are equal, round, and reactive to light.  Neck:  Musculoskeletal: Normal range of motion and neck supple.  Cardiovascular:     Rate and Rhythm: Normal rate and regular rhythm.     Heart sounds: Normal heart sounds.  Pulmonary:     Effort: Pulmonary effort is normal. No respiratory distress.     Breath sounds: No wheezing.  Abdominal:     General: Bowel sounds are normal. There is no distension.     Palpations: Abdomen is soft. There is no mass.     Tenderness: There is no abdominal tenderness.  Musculoskeletal: Normal range of motion.        General: No deformity.  Skin:    General: Skin is warm and dry.     Coloration: Skin is pale.     Findings: No erythema or rash.  Neurological:     Mental Status: She is alert and oriented to person, place, and time.     Cranial Nerves: No cranial nerve deficit.     Coordination: Coordination normal.  Psychiatric:        Behavior: Behavior normal.        Thought Content: Thought content normal.       CMP Latest Ref Rng & Units 11/04/2018  Glucose 65 - 99 mg/dL 89  BUN 6 - 24 mg/dL 11  Creatinine 0.57 - 1.00 mg/dL 0.82  Sodium 134 - 144 mmol/L 140  Potassium 3.5 - 5.2 mmol/L 4.4  Chloride 96 - 106 mmol/L 105  CO2 20 - 29 mmol/L 21  Calcium 8.7 - 10.2 mg/dL 9.2   Total Protein 6.0 - 8.5 g/dL 6.6  Total Bilirubin 0.0 - 1.2 mg/dL 0.2  Alkaline Phos 39 - 117 IU/L 45  AST 0 - 40 IU/L 18  ALT 0 - 32 IU/L 11   CBC Latest Ref Rng & Units 11/08/2018  WBC 4.0 - 10.5 K/uL 3.7(L)  Hemoglobin 12.0 - 15.0 g/dL 6.9(L)  Hematocrit 36.0 - 46.0 % 25.1(L)  Platelets 150 - 400 K/uL 305     LABORATORY DATA:  I have reviewed the data as listed Lab Results  Component Value Date   WBC 3.7 (L) 11/08/2018   HGB 6.9 (L) 11/08/2018   HCT 25.1 (L) 11/08/2018   MCV 64.2 (L) 11/08/2018   PLT 305 11/08/2018   Recent Labs    11/04/18 1024  NA 140  K 4.4  CL 105  CO2 21  GLUCOSE 89  BUN 11  CREATININE 0.82  CALCIUM 9.2  GFRNONAA 85  GFRAA 98  PROT 6.6  ALBUMIN 4.3  AST 18  ALT 11  ALKPHOS 45  BILITOT 0.2   Iron/TIBC/Ferritin/ %Sat    Component Value Date/Time   IRON 11 (L) 11/04/2018 1024   TIBC 374 11/04/2018 1024   FERRITIN 3 (L) 11/04/2018 1024   IRONPCTSAT 3 (LL) 11/04/2018 1024     No results found.    ASSESSMENT & PLAN:  1. Iron deficiency anemia due to chronic blood loss   2. Menorrhagia with irregular cycle    Labs are reviewed and discussed with patient. Consistent with severe iron deficiency anemia. 4 days ago, hemoglobin 6.8.  We will repeat CBC and hold tube today.  We had a discussion about if hemoglobin is less than 7, recommend 1 unit of PRBC transfusion.  Blood transfusion rationale and side effects were discussed in details with patient. Plan IV iron with Venofer 253m weekly x 4 doses. Allergy reactions/infusion reaction including anaphylactic reaction discussed with patient. Other side effects include but not limited to high  blood pressure, skin rash, weight gain, leg swelling, etc. Patient voices understanding and willing to proceed.  #Menorrhagia appears to be root course of her severe iron deficiency anemia. Recommend patient to make follow-up appointment with gynecology for further management.  Orders Placed This  Encounter  Procedures  . CBC with Differential/Platelet    Standing Status:   Future    Number of Occurrences:   1    Standing Expiration Date:   11/08/2019  . CBC with Differential/Platelet    Standing Status:   Future    Standing Expiration Date:   11/08/2019  . Ferritin    Standing Status:   Future    Standing Expiration Date:   11/08/2019  . Iron and TIBC    Standing Status:   Future    Standing Expiration Date:   11/08/2019  . Hold Tube- Blood Bank    Standing Status:   Future    Number of Occurrences:   1    Standing Expiration Date:   11/08/2019  . Hold Tube- Blood Bank    Standing Status:   Future    Standing Expiration Date:   11/08/2019    All questions were answered. The patient knows to call the clinic with any problems questions or concerns.  Cc Fenton Malling M, [place]*  Return of visit: 10 weeks.   Thank you for this kind referral and the opportunity to participate in the care of this patient. A copy of today's note is routed to referring provider  Total face to face encounter time for this patient visit was 41mn. >50% of the time was  spent in counseling and coordination of care.    ZEarlie Server MD, PhD Hematology Oncology CSutter-Yuba Psychiatric Health Facilityat AGuthrie Corning HospitalPager- [phone]/20/2020

## 2018-11-09 ENCOUNTER — Other Ambulatory Visit: Payer: Self-pay | Admitting: Oncology

## 2018-11-09 ENCOUNTER — Encounter: Payer: Self-pay | Admitting: Oncology

## 2018-11-09 DIAGNOSIS — D500 Iron deficiency anemia secondary to blood loss (chronic): Secondary | ICD-10-CM

## 2018-11-09 DIAGNOSIS — D649 Anemia, unspecified: Secondary | ICD-10-CM

## 2018-11-09 HISTORY — DX: Iron deficiency anemia secondary to blood loss (chronic): D50.0

## 2018-11-09 NOTE — Addendum Note (Signed)
Addended by: Earlie Server on: 11/09/2018 11:18 AM   Modules accepted: Orders

## 2018-11-10 ENCOUNTER — Inpatient Hospital Stay: Payer: Medicare Other

## 2018-11-10 ENCOUNTER — Other Ambulatory Visit: Payer: Self-pay

## 2018-11-10 DIAGNOSIS — D500 Iron deficiency anemia secondary to blood loss (chronic): Secondary | ICD-10-CM | POA: Diagnosis not present

## 2018-11-10 DIAGNOSIS — D649 Anemia, unspecified: Secondary | ICD-10-CM

## 2018-11-10 LAB — PREPARE RBC (CROSSMATCH)

## 2018-11-10 LAB — ABO/RH: ABO/RH(D): A POS

## 2018-11-10 MED ORDER — SODIUM CHLORIDE 0.9% IV SOLUTION
250.0000 mL | Freq: Once | INTRAVENOUS | Status: AC
  Administered 2018-11-10: 250 mL via INTRAVENOUS
  Filled 2018-11-10: qty 250

## 2018-11-10 MED ORDER — DIPHENHYDRAMINE HCL 25 MG PO CAPS
25.0000 mg | ORAL_CAPSULE | Freq: Once | ORAL | Status: AC
  Administered 2018-11-10: 25 mg via ORAL
  Filled 2018-11-10: qty 1

## 2018-11-10 MED ORDER — ACETAMINOPHEN 325 MG PO TABS
650.0000 mg | ORAL_TABLET | Freq: Once | ORAL | Status: AC
  Administered 2018-11-10: 650 mg via ORAL
  Filled 2018-11-10: qty 2

## 2018-11-11 LAB — TYPE AND SCREEN
ABO/RH(D): A POS
Antibody Screen: NEGATIVE
Unit division: 0

## 2018-11-11 LAB — BPAM RBC
Blood Product Expiration Date: 202007222359
ISSUE DATE / TIME: 202007221024
Unit Type and Rh: 6200

## 2018-11-12 ENCOUNTER — Other Ambulatory Visit: Payer: Self-pay

## 2018-11-12 ENCOUNTER — Inpatient Hospital Stay: Payer: Medicare Other

## 2018-11-12 VITALS — BP 125/75 | HR 81 | Temp 97.0°F | Resp 18

## 2018-11-12 DIAGNOSIS — D500 Iron deficiency anemia secondary to blood loss (chronic): Secondary | ICD-10-CM

## 2018-11-12 MED ORDER — IRON SUCROSE 20 MG/ML IV SOLN
200.0000 mg | Freq: Once | INTRAVENOUS | Status: AC
  Administered 2018-11-12: 14:00:00 200 mg via INTRAVENOUS
  Filled 2018-11-12: qty 10

## 2018-11-12 MED ORDER — SODIUM CHLORIDE 0.9 % IV SOLN
Freq: Once | INTRAVENOUS | Status: AC
  Administered 2018-11-12: 14:00:00 via INTRAVENOUS
  Filled 2018-11-12: qty 250

## 2018-11-13 ENCOUNTER — Encounter: Payer: Self-pay | Admitting: Oncology

## 2018-11-16 ENCOUNTER — Ambulatory Visit: Payer: Medicare Other

## 2018-11-17 ENCOUNTER — Telehealth: Payer: Self-pay

## 2018-11-17 ENCOUNTER — Ambulatory Visit
Admission: RE | Admit: 2018-11-17 | Discharge: 2018-11-17 | Disposition: A | Discharge status: Home / Self Care | Payer: Medicare Other | Source: Ambulatory Visit | Attending: Physician Assistant | Admitting: Physician Assistant

## 2018-11-17 ENCOUNTER — Other Ambulatory Visit: Payer: Self-pay

## 2018-11-17 DIAGNOSIS — E0590 Thyrotoxicosis, unspecified without thyrotoxic crisis or storm: Secondary | ICD-10-CM | POA: Diagnosis present

## 2018-11-17 NOTE — Telephone Encounter (Signed)
-----   Message from Mar Daring, [place] sent at 11/17/2018  3:05 PM EDT ----- Thyroid US is normal. No nodules. Can recheck labs and will evaluate for antibodies in 4-6 weeks

## 2018-11-17 NOTE — Telephone Encounter (Signed)
Viewed by Ricci Barker on 11/17/2018 3:06 PM Written by Mar Daring, PA-C on 11/17/2018 3:05 PM Thyroid US is normal. No nodules. Can recheck labs and will evaluate for antibodies in 4-6 weeks

## 2018-11-18 ENCOUNTER — Other Ambulatory Visit: Payer: Self-pay

## 2018-11-18 ENCOUNTER — Inpatient Hospital Stay: Payer: Medicare Other

## 2018-11-18 ENCOUNTER — Encounter: Payer: Self-pay | Admitting: Physician Assistant

## 2018-11-18 VITALS — BP 117/74 | HR 77 | Temp 99.0°F | Resp 18

## 2018-11-18 DIAGNOSIS — D500 Iron deficiency anemia secondary to blood loss (chronic): Secondary | ICD-10-CM

## 2018-11-18 MED ORDER — SODIUM CHLORIDE 0.9 % IV SOLN
Freq: Once | INTRAVENOUS | Status: AC
  Administered 2018-11-18: 14:00:00 via INTRAVENOUS
  Filled 2018-11-18: qty 250

## 2018-11-18 MED ORDER — IRON SUCROSE 20 MG/ML IV SOLN
200.0000 mg | Freq: Once | INTRAVENOUS | Status: AC
  Administered 2018-11-18: 14:00:00 200 mg via INTRAVENOUS
  Filled 2018-11-18: qty 10

## 2018-11-19 ENCOUNTER — Inpatient Hospital Stay: Payer: Medicare Other

## 2018-11-25 ENCOUNTER — Other Ambulatory Visit: Payer: Self-pay

## 2018-11-25 ENCOUNTER — Inpatient Hospital Stay: Payer: Medicare Other | Attending: Oncology

## 2018-11-25 VITALS — BP 125/75 | HR 60 | Temp 99.5°F | Resp 18

## 2018-11-25 DIAGNOSIS — D500 Iron deficiency anemia secondary to blood loss (chronic): Secondary | ICD-10-CM | POA: Diagnosis present

## 2018-11-25 MED ORDER — SODIUM CHLORIDE 0.9 % IV SOLN
Freq: Once | INTRAVENOUS | Status: AC
  Administered 2018-11-25: 14:00:00 via INTRAVENOUS
  Filled 2018-11-25: qty 250

## 2018-11-25 MED ORDER — IRON SUCROSE 20 MG/ML IV SOLN
200.0000 mg | Freq: Once | INTRAVENOUS | Status: AC
  Administered 2018-11-25: 200 mg via INTRAVENOUS
  Filled 2018-11-25: qty 10

## 2018-11-26 ENCOUNTER — Ambulatory Visit: Payer: Medicare Other

## 2018-12-02 ENCOUNTER — Other Ambulatory Visit: Payer: Self-pay

## 2018-12-02 ENCOUNTER — Inpatient Hospital Stay: Payer: Medicare Other

## 2018-12-02 VITALS — BP 128/71 | HR 78 | Temp 98.3°F | Resp 18

## 2018-12-02 DIAGNOSIS — D500 Iron deficiency anemia secondary to blood loss (chronic): Secondary | ICD-10-CM

## 2018-12-02 MED ORDER — IRON SUCROSE 20 MG/ML IV SOLN
200.0000 mg | Freq: Once | INTRAVENOUS | Status: AC
  Administered 2018-12-02: 200 mg via INTRAVENOUS
  Filled 2018-12-02: qty 10

## 2018-12-02 MED ORDER — SODIUM CHLORIDE 0.9 % IV SOLN
Freq: Once | INTRAVENOUS | Status: AC
  Administered 2018-12-02: 14:00:00 via INTRAVENOUS
  Filled 2018-12-02: qty 250

## 2018-12-03 ENCOUNTER — Ambulatory Visit: Payer: Medicare Other

## 2018-12-07 NOTE — Progress Notes (Signed)
Patient: Susan Howard Female    DOB: 08-24-1970   48 y.o.   MRN: [id number] Visit Date: 12/08/2018  Today's Provider: Mar Daring, PA-C   Chief Complaint  Patient presents with  . Dysuria   Subjective:     Dysuria  This is a new problem. The current episode started in the past 7 days (started on Saturday). The problem occurs intermittently. The problem has been unchanged. The quality of the pain is described as burning. There has been no fever. She is sexually active. There is no history of pyelonephritis. Associated symptoms include frequency and urgency. Pertinent negatives include no chills, discharge, flank pain or hematuria. She has tried increased fluids (AZO) for the symptoms. The treatment provided no relief. Her past medical history is significant for recurrent UTIs.    Allergies  Allergen Reactions  . Latex Other (See Comments) and Rash    Respiratory distress: patient allergic to ALL LATEX  . Chlorine Itching and Other (See Comments)    Allergen - chlorine Reaction - sneezing  . Other Itching and Other (See Comments)    Allergen - (blue) chux pads Reaction - sneezing Allergen - (blue) chux pads Reaction - sneezing Allergen - chlorine Reaction - sneezing   . Tape Itching    Allergen - pink tape  . Clindamycin/Lincomycin Rash  . Septra [Sulfamethoxazole-Trimethoprim] Rash     Current Outpatient Medications:  .  omeprazole (PRILOSEC) 40 MG capsule, Take 1 capsule (40 mg total) by mouth daily., Disp: 30 capsule, Rfl: 3  Review of Systems  Constitutional: Negative for chills.  Genitourinary: Positive for dysuria, frequency and urgency. Negative for flank pain and hematuria.    Social History   Tobacco Use  . Smoking status: Never Smoker  . Smokeless tobacco: Never Used  Substance Use Topics  . Alcohol use: No    Alcohol/week: 0.0 standard drinks      Objective:   BP 122/88 (BP Location: Left Arm, Patient Position: Sitting, Cuff Size:  Normal)   Pulse 66   Temp (!) 97.5 F (36.4 C) (Other (Comment)) Comment (Src): forehead  Resp 16   Wt 107 lb 14.4 oz (48.9 kg)   SpO2 99%   BMI 20.39 kg/m  Vitals:   12/08/18 1324  BP: 122/88  Pulse: 66  Resp: 16  Temp: (!) 97.5 F (36.4 C)  TempSrc: Other (Comment)  SpO2: 99%  Weight: 107 lb 14.4 oz (48.9 kg)     Physical Exam Vitals signs reviewed.  Constitutional:      General: She is not in acute distress.    Appearance: Normal appearance. She is well-developed and normal weight. She is not ill-appearing or diaphoretic.  Cardiovascular:     Rate and Rhythm: Normal rate and regular rhythm.     Heart sounds: Normal heart sounds. No murmur. No friction rub. No gallop.   Pulmonary:     Effort: Pulmonary effort is normal. No respiratory distress.     Breath sounds: Normal breath sounds. No wheezing or rales.  Abdominal:     General: Bowel sounds are normal. There is no distension.     Palpations: Abdomen is soft. There is no mass.     Tenderness: There is no abdominal tenderness. There is no guarding or rebound.  Skin:    General: Skin is warm and dry.       Neurological:     Mental Status: She is alert and oriented to person, place, and time.  Results for orders placed or performed in visit on 12/08/18  POCT urinalysis dipstick  Result Value Ref Range   Color, UA yellow    Clarity, UA Cloudy    Glucose, UA Negative Negative   Bilirubin, UA Negative    Ketones, UA Negative    Spec Grav, UA 1.020 1.010 - 1.025   Blood, UA Negative    pH, UA 6.0 5.0 - 8.0   Protein, UA Negative Negative   Urobilinogen, UA 0.2 0.2 or 1.0 E.U./dL   Nitrite, UA Positive    Leukocytes, UA Negative Negative   Appearance     Odor Positive        Assessment & Plan    1. Dysuria UA positive. Will send for culture as below.  - Urine Culture  2. Acute cystitis without hematuria Worsening symptoms. UA positive. Will treat empirically with Macrobid as below. Continue to  push fluids. Urine sent for culture. Will follow up pending C&S results. She is to call if symptoms do not improve or if they worsen.  - nitrofurantoin, macrocrystal-monohydrate, (MACROBID) 100 MG capsule; Take 1 capsule (100 mg total) by mouth 2 (two) times daily.  Dispense: 20 capsule; Refill: 4  3. Pressure injury of left hip, stage 1 Patient has decreased sensation due to spina bifida and gets recurrence of a pressure sore over the left ischial tuberosity. Will treat with doxycycline as below. Also advised to pad area as tolerated.  - doxycycline (VIBRA-TABS) 100 MG tablet; Take 1 tablet (100 mg total) by mouth 2 (two) times daily.  Dispense: 20 tablet; Refill: 0    Mar Daring, PA-C  [facility]

## 2018-12-08 ENCOUNTER — Ambulatory Visit (INDEPENDENT_AMBULATORY_CARE_PROVIDER_SITE_OTHER): Payer: Medicare Other | Admitting: Physician Assistant

## 2018-12-08 ENCOUNTER — Other Ambulatory Visit: Payer: Self-pay

## 2018-12-08 ENCOUNTER — Encounter: Payer: Self-pay | Admitting: Physician Assistant

## 2018-12-08 VITALS — BP 122/88 | HR 66 | Temp 97.5°F | Resp 16 | Wt 107.9 lb

## 2018-12-08 DIAGNOSIS — L89221 Pressure ulcer of left hip, stage 1: Secondary | ICD-10-CM

## 2018-12-08 DIAGNOSIS — R300 Dysuria: Secondary | ICD-10-CM

## 2018-12-08 DIAGNOSIS — N3000 Acute cystitis without hematuria: Secondary | ICD-10-CM | POA: Diagnosis not present

## 2018-12-08 LAB — POCT URINALYSIS DIPSTICK
Bilirubin, UA: NEGATIVE
Blood, UA: NEGATIVE
Glucose, UA: NEGATIVE
Ketones, UA: NEGATIVE
Leukocytes, UA: NEGATIVE
Nitrite, UA: POSITIVE
Odor: POSITIVE
Protein, UA: NEGATIVE
Spec Grav, UA: 1.02 (ref 1.010–1.025)
Urobilinogen, UA: 0.2 E.U./dL
pH, UA: 6 (ref 5.0–8.0)

## 2018-12-08 MED ORDER — NITROFURANTOIN MONOHYD MACRO 100 MG PO CAPS: 100.0000 mg | ORAL_CAPSULE | Freq: Two times a day (BID) | ORAL | 4 refills | Status: DC

## 2018-12-08 MED ORDER — DOXYCYCLINE HYCLATE 100 MG PO TABS: 100.0000 mg | ORAL_TABLET | Freq: Two times a day (BID) | ORAL | 0 refills | Status: DC

## 2018-12-10 LAB — URINE CULTURE

## 2018-12-20 ENCOUNTER — Encounter: Payer: Self-pay | Admitting: Physician Assistant

## 2019-01-17 ENCOUNTER — Other Ambulatory Visit: Payer: Self-pay

## 2019-01-17 ENCOUNTER — Inpatient Hospital Stay: Payer: Medicare Other | Attending: Oncology

## 2019-01-17 DIAGNOSIS — Z8249 Family history of ischemic heart disease and other diseases of the circulatory system: Secondary | ICD-10-CM | POA: Insufficient documentation

## 2019-01-17 DIAGNOSIS — Z803 Family history of malignant neoplasm of breast: Secondary | ICD-10-CM | POA: Diagnosis not present

## 2019-01-17 DIAGNOSIS — N920 Excessive and frequent menstruation with regular cycle: Secondary | ICD-10-CM | POA: Insufficient documentation

## 2019-01-17 DIAGNOSIS — R5383 Other fatigue: Secondary | ICD-10-CM | POA: Insufficient documentation

## 2019-01-17 DIAGNOSIS — R0602 Shortness of breath: Secondary | ICD-10-CM | POA: Diagnosis not present

## 2019-01-17 DIAGNOSIS — D500 Iron deficiency anemia secondary to blood loss (chronic): Secondary | ICD-10-CM | POA: Diagnosis present

## 2019-01-17 LAB — CBC WITH DIFFERENTIAL/PLATELET
Abs Immature Granulocytes: 0.01 10*3/uL (ref 0.00–0.07)
Basophils Absolute: 0 10*3/uL (ref 0.0–0.1)
Basophils Relative: 1 %
Eosinophils Absolute: 0 10*3/uL (ref 0.0–0.5)
Eosinophils Relative: 1 %
HCT: 42 % (ref 36.0–46.0)
Hemoglobin: 13.7 g/dL (ref 12.0–15.0)
Immature Granulocytes: 0 %
Lymphocytes Relative: 25 %
Lymphs Abs: 1.4 10*3/uL (ref 0.7–4.0)
MCH: 27.7 pg (ref 26.0–34.0)
MCHC: 32.6 g/dL (ref 30.0–36.0)
MCV: 85 fL (ref 80.0–100.0)
Monocytes Absolute: 0.3 10*3/uL (ref 0.1–1.0)
Monocytes Relative: 6 %
Neutro Abs: 4 10*3/uL (ref 1.7–7.7)
Neutrophils Relative %: 67 %
Platelets: 250 10*3/uL (ref 150–400)
RBC: 4.94 MIL/uL (ref 3.87–5.11)
RDW: 22.1 % — ABNORMAL HIGH (ref 11.5–15.5)
WBC: 5.8 10*3/uL (ref 4.0–10.5)
nRBC: 0 % (ref 0.0–0.2)

## 2019-01-17 LAB — IRON AND TIBC
Iron: 66 ug/dL (ref 28–170)
Saturation Ratios: 23 % (ref 10.4–31.8)
TIBC: 286 ug/dL (ref 250–450)
UIBC: 220 ug/dL

## 2019-01-17 LAB — SAMPLE TO BLOOD BANK

## 2019-01-17 LAB — FERRITIN: Ferritin: 35 ng/mL (ref 11–307)

## 2019-01-19 ENCOUNTER — Encounter: Payer: Self-pay | Admitting: Oncology

## 2019-01-19 ENCOUNTER — Inpatient Hospital Stay: Payer: Medicare Other

## 2019-01-19 ENCOUNTER — Inpatient Hospital Stay (HOSPITAL_BASED_OUTPATIENT_CLINIC_OR_DEPARTMENT_OTHER): Payer: Medicare Other | Admitting: Oncology

## 2019-01-19 ENCOUNTER — Other Ambulatory Visit: Payer: Self-pay

## 2019-01-19 ENCOUNTER — Telehealth: Payer: Self-pay

## 2019-01-19 VITALS — BP 122/83 | HR 64 | Temp 98.0°F | Resp 16 | Wt 108.4 lb

## 2019-01-19 DIAGNOSIS — N921 Excessive and frequent menstruation with irregular cycle: Secondary | ICD-10-CM

## 2019-01-19 DIAGNOSIS — D500 Iron deficiency anemia secondary to blood loss (chronic): Secondary | ICD-10-CM | POA: Diagnosis not present

## 2019-01-19 NOTE — Telephone Encounter (Signed)
Pt calling; started bleeding heavily Sunday after being off period 1 1/2 wks; pt asking for provera 10mg x5d (it has stopped it before and reset her hormones); pt feels she can't wait for her appt on the 9th to address this heavy bleeding.  [phone]  Pt just wants to talk to [facility] about this long story of heavy bleeding.  Her HGB is up to 13 - blood drawn yesterday.  Has appt c hematologist today.  She is saturating pad just about every hour - states bleeding is a little better today.  Sugg she go to ED which is our policy.  Pt doesn't want to do that.  Pt knows if she has a hyst it needs [facility] d/t artificial sfincter and the need to be coordinated c urologist.  She doesn't want surgery b/c of more scar tissue.  Adv CLG on vac this week but will send msg to her.  Pt aware I am unable to tell her when CLG will call her.

## 2019-01-19 NOTE — Progress Notes (Signed)
Patient does not offer any problems today.  

## 2019-01-20 NOTE — Progress Notes (Signed)
Hematology/Oncology follow up note [facility] Telephone:([phone] [phone]   Patient Care Team: [name] as PCP - General (Family Medicine)  REFERRING PROVIDER: Mar Daring, Mamie Nick* CHIEF COMPLAINTS/REASON FOR VISIT:  Follow up for iron deficiency anemia  HISTORY OF PRESENTING ILLNESS:  Susan Howard is a  48 y.o.  female with PMH listed below was seen in consultation at the request of Mar Daring, P*   for evaluation of iron deficiency anemia.   Reviewed patient's recent labs  11/04/2018 labs revealed anemia with hemoglobin of 6.8, MCV 64, platelet count of 294,000, WBC 6. Normal CMP Iron panel was checked on 11/04/2018.  Iron saturation 3, ferritin 3.  TIBC 374. No other recent hemoglobin level.  Hemoglobin in 2017 was 11.3.  MCV 82.  Patient reports history of heavy peri-menopausal bleeding.  A while ago, patient had nonstop bleeding for 6 weeks.  He contacted his GYN provider and was prescribed to use Provera. 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 Patient is very athletic.  She continues to run and bike.  She has experienced decrease of exercise duration likely.  However, she continues to exercise heavily as she has been used to doing exercise and is difficult for her to stop.  She tells me that she wore cast during her childhood due to spina bifida and she swore to herself that if she were able to read off the cast, she would do not stop running" She has been advised to start oral iron supplementations.  She took iron supplementation for short term of.  And stopped as she is not a medicine taker. Patient works as a Marine scientist at [employer] signs and symptoms: Patient reports fatigue.  She endorses SOB with exercise.   Denies weight loss, easy bruising, hematochezia, hemoptysis, hematuria. Context:  Rectal bleeding: Occasionally she sees blood in the  stool. Menstrual bleeding/ Vaginal bleeding : Heavy Hematemesis or hemoptysis : denies Blood in urine : denies  Last endoscopy: None available Fatigue: Yes.  SOB: Yes  INTERVAL HISTORY Susan Howard is a 48 y.o. female who has above history reviewed by me today presents for follow up visit for management of iron deficiency anemia Problems and complaints are listed below: Patient has received IV Venofer weekly x4 doses.  She reports fatigue has significantly improved.  She is able to back to go back to her baseline exercise activity. Her menstrual flow has improved after Provera until recently she started to have little more heavier menstrual flow again.  Review of Systems  Constitutional: Negative for appetite change, chills, fatigue and fever.  HENT:   Negative for hearing loss and voice change.   Eyes: Negative for eye problems.  Respiratory: Negative for chest tightness and cough.   Cardiovascular: Negative for chest pain.  Gastrointestinal: Negative for abdominal distention, abdominal pain and blood in stool.  Endocrine: Negative for hot flashes.  Genitourinary: Negative for difficulty urinating and frequency.        Heavy irregular menstrual bleeding  Musculoskeletal: Negative for arthralgias.  Skin: Negative for itching and rash.  Neurological: Negative for extremity weakness.  Hematological: Negative for adenopathy.  Psychiatric/Behavioral: Negative for confusion.    MEDICAL HISTORY:  Past Medical History:  Diagnosis Date  . Decubitus ulcer of buttock, stage 2 ([place])   . Family history of breast cancer    declined BRCA testing 2013  . Iron deficiency anemia due  to chronic blood loss 11/09/2018  . MRSA (methicillin resistant Staphylococcus aureus)   . Spina bifida ([place])   . Status post implantation of artificial urinary sphincter 2006   neurogenic bladder  . UTI (urinary tract infection) 12/2016    SURGICAL HISTORY: Past Surgical History:  Procedure Laterality  Date  . BACK SURGERY     for spinal bifida  . CESAREAN SECTION  [phone]   G4P4  . 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 HIP SURGERY  2012   [facility]-transfer of muscle from left buttock  . TUBAL LIGATION  1996  . URINARY SPHINCTER REVISION  10/18/2016   urinary sphincter prosthesis for urinary incontinence with multiple revisions. Last revision 09/2014    SOCIAL HISTORY: Social History   Socioeconomic History  . Marital status: Married    Spouse name: Not on file  . Number of children: 4  . Years of education: Bachelor's  . Highest education level: Not on file  Occupational History  . Occupation: Therapist, sports  Social Needs  . Financial resource strain: Not on file  . Food insecurity    Worry: Not on file    Inability: Not on file  . Transportation needs    Medical: Not on file    Non-medical: Not on file  Tobacco Use  . Smoking status: Never Smoker  . Smokeless tobacco: Never Used  Substance and Sexual Activity  . Alcohol use: No    Alcohol/week: 0.0 standard drinks  . Drug use: No  . Sexual activity: Yes    Partners: Male    Birth control/protection: Surgical    Comment: tubal ligation  Lifestyle  . Physical activity    Days per week: Not on file    Minutes per session: Not on file  . Stress: Not on file  Relationships  . Social Herbalist on phone: Not on file    Gets together: Not on file    Attends religious service: Not on file    Active member of club or organization: Not on file    Attends meetings of clubs or organizations: Not on file    Relationship status: Not on file  . Intimate partner violence    Fear of current or ex partner: Not on file    Emotionally abused: Not on file    Physically abused: Not on file    Forced sexual activity: Not on file  Other Topics Concern  . Not on file  Social History Narrative  . Not on file    FAMILY HISTORY: Family History  Problem Relation Age of Onset   . Breast cancer Mother 13  . Hypertension Mother   . Leukemia Father   . Neural tube defect Son        spinal bifida  . Breast cancer Maternal Grandmother 61  . Hypertension Maternal Grandmother   . Heart disease Maternal Grandfather   . Heart attack Maternal Grandfather 55    ALLERGIES:  is allergic to latex; chlorine; other; tape; clindamycin/lincomycin; and septra [sulfamethoxazole-trimethoprim].  MEDICATIONS:  Current Outpatient Medications  Medication Sig Dispense Refill  . Iron-Vitamin C (VITRON-C) 65-125 MG TABS Take by mouth.     No current facility-administered medications for this visit.      PHYSICAL EXAMINATION: ECOG PERFORMANCE STATUS: 0 - Asymptomatic Vitals:   01/19/19 1306  BP: 122/83  Pulse: 64  Resp: 16  Temp: 98 F (36.7 C)  Filed Weights   01/19/19 1306  Weight: 108 lb 6.4 oz (49.2 kg)    Physical Exam Constitutional:      General: She is not in acute distress. HENT:     Head: Normocephalic and atraumatic.  Eyes:     General: No scleral icterus.    Pupils: Pupils are equal, round, and reactive to light.  Neck:     Musculoskeletal: Normal range of motion and neck supple.  Cardiovascular:     Rate and Rhythm: Normal rate and regular rhythm.     Heart sounds: Normal heart sounds.  Pulmonary:     Effort: Pulmonary effort is normal. No respiratory distress.     Breath sounds: No wheezing.  Abdominal:     General: Bowel sounds are normal. There is no distension.     Palpations: Abdomen is soft. There is no mass.     Tenderness: There is no abdominal tenderness.  Musculoskeletal: Normal range of motion.        General: No deformity.  Skin:    General: Skin is warm and dry.     Coloration: Skin is not pale.     Findings: No erythema or rash.  Neurological:     Mental Status: She is alert and oriented to person, place, and time.     Cranial Nerves: No cranial nerve deficit.     Coordination: Coordination normal.  Psychiatric:         Behavior: Behavior normal.        Thought Content: Thought content normal.       CMP Latest Ref Rng & Units 11/04/2018  Glucose 65 - 99 mg/dL 89  BUN 6 - 24 mg/dL 11  Creatinine 0.57 - 1.00 mg/dL 0.82  Sodium 134 - 144 mmol/L 140  Potassium 3.5 - 5.2 mmol/L 4.4  Chloride 96 - 106 mmol/L 105  CO2 20 - 29 mmol/L 21  Calcium 8.7 - 10.2 mg/dL 9.2  Total Protein 6.0 - 8.5 g/dL 6.6  Total Bilirubin 0.0 - 1.2 mg/dL 0.2  Alkaline Phos 39 - 117 IU/L 45  AST 0 - 40 IU/L 18  ALT 0 - 32 IU/L 11   CBC Latest Ref Rng & Units 01/17/2019  WBC 4.0 - 10.5 K/uL 5.8  Hemoglobin 12.0 - 15.0 g/dL 13.7  Hematocrit 36.0 - 46.0 % 42.0  Platelets 150 - 400 K/uL 250     LABORATORY DATA:  I have reviewed the data as listed Lab Results  Component Value Date   WBC 5.8 01/17/2019   HGB 13.7 01/17/2019   HCT 42.0 01/17/2019   MCV 85.0 01/17/2019   PLT 250 01/17/2019   Recent Labs    11/04/18 1024  NA 140  K 4.4  CL 105  CO2 21  GLUCOSE 89  BUN 11  CREATININE 0.82  CALCIUM 9.2  GFRNONAA 85  GFRAA 98  PROT 6.6  ALBUMIN 4.3  AST 18  ALT 11  ALKPHOS 45  BILITOT 0.2   Iron/TIBC/Ferritin/ %Sat    Component Value Date/Time   IRON 66 01/17/2019 1105   IRON 11 (L) 11/04/2018 1024   TIBC 286 01/17/2019 1105   TIBC 374 11/04/2018 1024   FERRITIN 35 01/17/2019 1105   FERRITIN 3 (L) 11/04/2018 1024   IRONPCTSAT 23 01/17/2019 1105   IRONPCTSAT 3 (LL) 11/04/2018 1024     No results found.    ASSESSMENT & PLAN:  1. Iron deficiency anemia due to chronic blood loss   2. Menorrhagia with irregular  cycle    #CBC showed stable hemoglobin. Iron panel showed improved iron store.  Hold additional IV iron treatment.  #Menorrhagia, recommend patient to follow-up with GYN for further discussion of management.  Orders Placed This Encounter  Procedures  . CBC with Differential/Platelet    Standing Status:   Future    Standing Expiration Date:   01/19/2020  . Ferritin    Standing Status:    Future    Standing Expiration Date:   01/19/2020  . Iron and TIBC    Standing Status:   Future    Standing Expiration Date:   01/19/2020    All questions were answered. The patient knows to call the clinic with any problems questions or concerns.  Cc Mar Daring, [place]*  Return of visit: 4 months     Earlie Server, MD, PhD Hematology Oncology [facility] Pager- [phone] 01/20/2019

## 2019-01-20 NOTE — Telephone Encounter (Signed)
I would not prescribe Provera until I did a evaluation including a pelvic ultrasound and an endometrial biopsy. Please offer her an appointment with a MD to evaluate and treat menorrhagia as I am not in office until 7 October. Thanks, [employer]

## 2019-01-21 NOTE — Telephone Encounter (Signed)
Spoke with patient about schedule ultrasound. Patient request to have ultrasound prior to schedule annual exam with [facility]. Patient is schedule for Thursday, 01/27/19 at 4 pm and Friday, 01/28/19 with Colleen. Patient prefers to see [facility]. Would you please place order for ultrasound thank you!

## 2019-01-21 NOTE — Telephone Encounter (Signed)
Patient is schedule for 01/28/19 with [facility]. Would you like to bring patient in for ultrasound prior to this schedule appointment. No current opening at this time with MD

## 2019-01-21 NOTE — Telephone Encounter (Signed)
Hi Clarise Cruz, I can see her 10/7 at 1130 if she needs to be seen sooner and if you can schedule her for an ultrasound at that time, that would be fine. If there are no pelvic ultrasounds available, can schedule that after I see her. If an MD has a cancellation prior to that, maybe she can be seen sooner to do an endometrial biopsy and discuss treatment.

## 2019-01-23 ENCOUNTER — Other Ambulatory Visit: Payer: Self-pay | Admitting: Certified Nurse Midwife

## 2019-01-23 DIAGNOSIS — N921 Excessive and frequent menstruation with irregular cycle: Secondary | ICD-10-CM

## 2019-01-23 NOTE — Telephone Encounter (Signed)
done

## 2019-01-27 ENCOUNTER — Other Ambulatory Visit: Payer: Self-pay

## 2019-01-27 ENCOUNTER — Ambulatory Visit (INDEPENDENT_AMBULATORY_CARE_PROVIDER_SITE_OTHER): Payer: Medicare Other

## 2019-01-27 DIAGNOSIS — N921 Excessive and frequent menstruation with irregular cycle: Secondary | ICD-10-CM

## 2019-01-28 ENCOUNTER — Other Ambulatory Visit (HOSPITAL_COMMUNITY)
Admission: RE | Admit: 2019-01-28 | Discharge: 2019-01-28 | Disposition: A | Discharge status: Home / Self Care | Payer: Medicare Other | Source: Ambulatory Visit | Attending: Certified Nurse Midwife | Admitting: Certified Nurse Midwife

## 2019-01-28 ENCOUNTER — Encounter: Payer: Self-pay | Admitting: Certified Nurse Midwife

## 2019-01-28 ENCOUNTER — Other Ambulatory Visit: Payer: Self-pay | Admitting: Certified Nurse Midwife

## 2019-01-28 ENCOUNTER — Ambulatory Visit (INDEPENDENT_AMBULATORY_CARE_PROVIDER_SITE_OTHER): Payer: Medicare Other | Admitting: Certified Nurse Midwife

## 2019-01-28 ENCOUNTER — Other Ambulatory Visit: Payer: Medicare Other

## 2019-01-28 VITALS — BP 150/90 | HR 80 | Ht 61.0 in | Wt 106.0 lb

## 2019-01-28 DIAGNOSIS — N939 Abnormal uterine and vaginal bleeding, unspecified: Secondary | ICD-10-CM

## 2019-01-28 DIAGNOSIS — Z1231 Encounter for screening mammogram for malignant neoplasm of breast: Secondary | ICD-10-CM

## 2019-01-28 DIAGNOSIS — Z803 Family history of malignant neoplasm of breast: Secondary | ICD-10-CM

## 2019-01-28 DIAGNOSIS — Z1239 Encounter for other screening for malignant neoplasm of breast: Secondary | ICD-10-CM

## 2019-01-28 DIAGNOSIS — Z9189 Other specified personal risk factors, not elsewhere classified: Secondary | ICD-10-CM

## 2019-01-28 DIAGNOSIS — Z01419 Encounter for gynecological examination (general) (routine) without abnormal findings: Secondary | ICD-10-CM | POA: Diagnosis not present

## 2019-01-28 DIAGNOSIS — Z124 Encounter for screening for malignant neoplasm of cervix: Secondary | ICD-10-CM | POA: Insufficient documentation

## 2019-01-28 MED ORDER — MEDROXYPROGESTERONE ACETATE 10 MG PO TABS: ORAL_TABLET | ORAL | 2 refills | Status: DC

## 2019-01-28 NOTE — Progress Notes (Signed)
Gynecology Annual Exam  PCP: Mar Daring, PA-C  Chief Complaint:  Chief Complaint  Patient presents with  . Gynecologic Exam    bleeding  . Follow-up    u/s    History of Present Illness:Susan Howard is a 48 year old Caucasian/White female , G 4 P 4 0 0 4 , who presents for her routine annual exam and an evaluation of abnormal uterine bleeding.  At the time of her last visit to [facility] in December 2019 she had metromenorrhagia x 2 months. She declined further evaluation at that time because the bleeding had stopped. On February 12, she began bleeding heavily and her bleeding lasted until the end of March. She was on a mission trip in [place] when her bleeding started and she was able to get a RX for Provera which stopped the bleeding. When she returned to the [place] her menses were coming every month, lasting 5 days with 1-2 heavy days.  She had a menses from 11-15 September then another from 27-31 Sept with 3 very heavy days. She became very anemic with her heavy bleeding in February and March and her hemoglobin got down to 6.8gm/dl. She received a blood transfusion and IV iron infusions x 4. Her last hemoglobin 9/28 was 13.7 gm/dl. She continues to take iron supplements.She denies dysmenorrhea.  She had an ultrasound yesterday which was normal except for a small subserosal fibroid. EM stripe was 3.6 mm  The patient's past medical history is notable for a history of spinal bifida, and club foot. She has a urinary prosthesis and has had many revisions of this prothesis that helps her with urinary incontinence. Her last revision was in 2016.   Since her last annual GYN exam dated 01/20/2017, she has been treated for a couple UTIs. She had a TSH done in July which was suppressed. Thyroid ultasound was negative. Had a workup in 1997 for suppressed TSH, but denies diagnosis of Graves. She is sexually active. She is currently using BTL for contraception.  Her most recent pap smear  was obtained 01/20/2017 and was NIL.Marland Kitchen  Her most recent mammogram obtained on 07/17/2014 was negative with extremely dense breasts There is a positive history of breast cancer in her mother and maternal grandmother. Genetic testing has not been done. Her lifetime risk of breast cancer is 22.4% There is no family history of ovarian cancer.  The patient does do monthly self breast exams.  The patient does not smoke.  The patient does not drink alcohol The patient does not use illegal drugs.  The patient exercises regularly (biking and running)  The patient does get adequate calcium in her diet.  She had a  cholesterol screen in 2020 that was normal.    The patient denies current symptoms of depression.    Review of Systems: Review of Systems  Constitutional: Negative for chills, fever and weight loss.  HENT: Negative for congestion, sinus pain and sore throat.   Eyes: Negative for blurred vision and pain.  Respiratory: Negative for hemoptysis, shortness of breath and wheezing.   Cardiovascular: Negative for chest pain, palpitations and leg swelling.  Gastrointestinal: Negative for abdominal pain, blood in stool, diarrhea, heartburn, nausea and vomiting.  Genitourinary: Negative for dysuria, frequency, hematuria and urgency.       Positive for AUB  Musculoskeletal: Negative for back pain, joint pain and myalgias.  Skin: Negative for itching and rash.  Neurological: Negative for dizziness, tingling and headaches.  Endo/Heme/Allergies: Negative  for environmental allergies and polydipsia. Does not bruise/bleed easily.       Negative for hirsutism   Psychiatric/Behavioral: Negative for depression. The patient is not nervous/anxious and does not have insomnia.     Past Medical History:  Past Medical History:  Diagnosis Date  . Decubitus ulcer of buttock, stage 2 ([place])   . Family history of breast cancer    declined BRCA testing 2013  . Iron deficiency anemia due to chronic blood loss  11/09/2018  . MRSA (methicillin resistant Staphylococcus aureus)   . Spina bifida ([place])   . Status post implantation of artificial urinary sphincter 2006   neurogenic bladder  . UTI (urinary tract infection) 12/2016    Past Surgical History:  Past Surgical History:  Procedure Laterality Date  . BACK SURGERY     for spinal bifida  . CESAREAN SECTION  [phone]   G4P4  . 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 HIP SURGERY  2012   [facility]-transfer of muscle from left buttock  . TUBAL LIGATION  1996  . URINARY SPHINCTER REVISION  10/18/2016   urinary sphincter prosthesis for urinary incontinence with multiple revisions. Last revision 09/2014    Family History:  Family History  Problem Relation Age of Onset  . Breast cancer Mother 63  . Hypertension Mother   . Leukemia Father 41  . Neural tube defect Son        spinal bifida  . Breast cancer Maternal Grandmother 38  . Hypertension Maternal Grandmother   . Heart disease Maternal Grandfather   . Heart attack Maternal Grandfather 55    Social History:  Social History   Socioeconomic History  . Marital status: Married    Spouse name: Not on file  . Number of children: 4  . Years of education: Bachelor's  . Highest education level: Not on file  Occupational History  . Occupation: Therapist, sports  Social Needs  . Financial resource strain: Not on file  . Food insecurity    Worry: Not on file    Inability: Not on file  . Transportation needs    Medical: Not on file    Non-medical: Not on file  Tobacco Use  . Smoking status: Never Smoker  . Smokeless tobacco: Never Used  Substance and Sexual Activity  . Alcohol use: No    Alcohol/week: 0.0 standard drinks  . Drug use: No  . Sexual activity: Yes    Partners: Male    Birth control/protection: Surgical    Comment: tubal ligation  Lifestyle  . Physical activity    Days per week: Not on file    Minutes per session: Not on file  .  Stress: Not on file  Relationships  . Social Herbalist on phone: Not on file    Gets together: Not on file    Attends religious service: Not on file    Active member of club or organization: Not on file    Attends meetings of clubs or organizations: Not on file    Relationship status: Not on file  . Intimate partner violence    Fear of current or ex partner: Not on file    Emotionally abused: Not on file    Physically abused: Not on file    Forced sexual activity: Not on file  Other Topics Concern  . Not on file  Social History Narrative  . Not on file  Allergies:  Allergies  Allergen Reactions  . Latex Other (See Comments) and Rash    Respiratory distress: patient allergic to ALL LATEX  . Other Itching and Other (See Comments)    Allergen - (blue) chux pads Reaction - sneezing Allergen - (blue) chux pads Reaction - sneezing Allergen - chlorine Reaction - sneezing   . Clindamycin/Lincomycin Rash  . Septra [Sulfamethoxazole-Trimethoprim] Rash    Medications: Current Outpatient Medications on File Prior to Visit  Medication Sig Dispense Refill  . Iron-Vitamin C (VITRON-C) 65-125 MG TABS Take by mouth.     No current facility-administered medications on file prior to visit.     Physical Exam Vitals: BP (!) 150/90   Pulse 80   Ht '5\' 1"'  (1.549 m)   Wt 106 lb (48.1 kg)   LMP 01/16/2019 (Exact Date)   BMI 20.03 kg/m   General: WF in NAD HEENT: normocephalic, anicteric Neck: no thyroid enlargement, no palpable nodules, no cervical lymphadenopathy  Pulmonary: No increased work of breathing, CTAB Cardiovascular: RRR, without murmur  Breast: Breast symmetrical, no tenderness, no palpable nodules or masses, no skin or nipple retraction present, no nipple discharge.  No axillary, infraclavicular or supraclavicular lymphadenopathy. Abdomen: Soft, non-tender, non-distended.  Umbilicus without lesions.  No hepatomegaly. Prosthesis palpable in right lower  quadrant. No evidence of hernia. Genitourinary:  External: Prosthesis in right labia majora.  Normal urethral meatus, normal Bartholin's and Skene's glands.    Vagina: Normal vaginal mucosa, white discharge  Cervix: Grossly normal in appearance, no bleeding, non-tender, prolapse just inside hymenal ring with bearing down.  Uterus: Midplane to Retroverted, normal size, shape, and consistency, mobile, and non-tender  Adnexa: No adnexal masses, non-tender  Rectal: deferred  Lymphatic: no evidence of inguinal lymphadenopathy Extremities: no edema, erythema, or tenderness Neurologic: Grossly intact Psychiatric: mood appropriate, affect full    Assessment: 48 y.o. G4 P4004 annual gyn exam Hx of menometrorrhagia resulting in anemia requiring blood transfusion and IV iron infusion  Discussed results of ultrasound yesterday and recommendation for endometrial biopsy to rule out endometrial hyperplasia.  She agreed with EMB and was premedicated with ibuprofen 600 mgm. See procedure below. Family history of breast cancer Increased risk of breast cancer  Plan:   1) Breast cancer screening - recommend monthly self breast exam and annual screening mammograms. Mammogram was ordered today. Patient to schedule a 3D mammogram. Offered breast MRIs and MYRISK testing at last annual due to increased risk from family history, and patient declined.  2) Colon cancer screening: DIscussed options for testing. Patient to consider options  3) Cervical cancer screening - Pap was done.   4) Contraception - BTL  5) Routine healthcare maintenance including cholesterol and diabetes screening managed by PCP   6) RTO in 1 year  Dalia Heading, CNM   Endometrial Biopsy After discussion with the patient regarding her abnormal uterine bleeding I recommended that she proceed with an endometrial biopsy for further diagnosis. The risks, benefits, alternatives, and indications for an endometrial biopsy were  discussed with the patient in detail. She understood the risks including infection, bleeding, cervical laceration and uterine perforation.  Written consent was obtained.   PROCEDURE NOTE:  P[place] endometrial biopsy was performed using aseptic technique with iodine preparation.  The cervix was sprayed with Hurricaine anesthetic and a tenaculum was applied to the posterior lip of the cervix. The uterus was sounded to a length of 9 cm. Adequate sampling was obtained with minimal blood loss.  The patient tolerated the procedure well.  A: Abnormal uterine bleeding. R/O endometrial hyperplasia  P: Post biopsy home instructions given Explained treatments for AUB including medical and surgical options incuding OCPs, progestins, Depo Provera, progestin IUDs, endometrial ablation and hysterectomy. She is interested in Provera cycling prn. RX for Provera. Recommend taking from day 16 to 25 of this cycle.  Will call with biopsy results.  Dalia Heading, CNM

## 2019-01-30 DIAGNOSIS — N921 Excessive and frequent menstruation with irregular cycle: Secondary | ICD-10-CM | POA: Insufficient documentation

## 2019-02-01 LAB — SURGICAL PATHOLOGY

## 2019-02-02 ENCOUNTER — Telehealth: Payer: Self-pay | Admitting: Certified Nurse Midwife

## 2019-02-02 LAB — CYTOLOGY - PAP: Diagnosis: NEGATIVE

## 2019-02-02 NOTE — Telephone Encounter (Signed)
Yareth called with results of her endometrial biopsy: proliferative endometrium. No atypical cells or hyperplasia. Discussed increased risk of anovulatory bleeding perimenopausally. Started Provera as directed on 10/12 x 10 days. Can repeat Provera for abnormal bleeding. Follow up as needed. Dalia Heading, CNM

## 2019-03-10 ENCOUNTER — Other Ambulatory Visit: Payer: Self-pay

## 2019-03-10 ENCOUNTER — Ambulatory Visit
Admission: RE | Admit: 2019-03-10 | Discharge: 2019-03-10 | Disposition: A | Discharge status: Home / Self Care | Payer: Medicare Other | Source: Ambulatory Visit | Attending: Certified Nurse Midwife | Admitting: Certified Nurse Midwife

## 2019-03-10 DIAGNOSIS — Z1231 Encounter for screening mammogram for malignant neoplasm of breast: Secondary | ICD-10-CM | POA: Diagnosis present

## 2019-03-25 ENCOUNTER — Ambulatory Visit: Payer: Medicare Other | Admitting: Obstetrics and Gynecology

## 2019-05-11 DIAGNOSIS — D500 Iron deficiency anemia secondary to blood loss (chronic): Secondary | ICD-10-CM | POA: Diagnosis not present

## 2019-05-15 DIAGNOSIS — Z1159 Encounter for screening for other viral diseases: Secondary | ICD-10-CM | POA: Diagnosis not present

## 2019-05-16 ENCOUNTER — Other Ambulatory Visit: Payer: Medicare Other

## 2019-05-17 DIAGNOSIS — Z803 Family history of malignant neoplasm of breast: Secondary | ICD-10-CM | POA: Diagnosis not present

## 2019-05-17 DIAGNOSIS — N884 Hypertrophic elongation of cervix uteri: Secondary | ICD-10-CM | POA: Diagnosis not present

## 2019-05-17 DIAGNOSIS — Q057 Lumbar spina bifida without hydrocephalus: Secondary | ICD-10-CM | POA: Diagnosis not present

## 2019-05-17 DIAGNOSIS — Z20822 Contact with and (suspected) exposure to covid-19: Secondary | ICD-10-CM | POA: Diagnosis not present

## 2019-05-17 DIAGNOSIS — N319 Neuromuscular dysfunction of bladder, unspecified: Secondary | ICD-10-CM | POA: Diagnosis not present

## 2019-05-17 DIAGNOSIS — N3281 Overactive bladder: Secondary | ICD-10-CM | POA: Diagnosis not present

## 2019-05-17 DIAGNOSIS — D509 Iron deficiency anemia, unspecified: Secondary | ICD-10-CM | POA: Diagnosis not present

## 2019-05-18 ENCOUNTER — Ambulatory Visit: Payer: Medicare Other | Admitting: Oncology

## 2019-05-18 ENCOUNTER — Ambulatory Visit: Payer: Medicare Other

## 2019-05-30 ENCOUNTER — Other Ambulatory Visit: Payer: Self-pay

## 2019-05-30 ENCOUNTER — Inpatient Hospital Stay: Payer: Medicare Other | Attending: Oncology

## 2019-05-30 DIAGNOSIS — D485 Neoplasm of uncertain behavior of skin: Secondary | ICD-10-CM | POA: Diagnosis not present

## 2019-05-30 DIAGNOSIS — D500 Iron deficiency anemia secondary to blood loss (chronic): Secondary | ICD-10-CM | POA: Insufficient documentation

## 2019-05-30 DIAGNOSIS — L2389 Allergic contact dermatitis due to other agents: Secondary | ICD-10-CM | POA: Diagnosis not present

## 2019-05-30 DIAGNOSIS — C44311 Basal cell carcinoma of skin of nose: Secondary | ICD-10-CM | POA: Diagnosis not present

## 2019-05-30 DIAGNOSIS — D2239 Melanocytic nevi of other parts of face: Secondary | ICD-10-CM | POA: Diagnosis not present

## 2019-05-30 DIAGNOSIS — R519 Headache, unspecified: Secondary | ICD-10-CM | POA: Insufficient documentation

## 2019-05-30 DIAGNOSIS — C44319 Basal cell carcinoma of skin of other parts of face: Secondary | ICD-10-CM | POA: Diagnosis not present

## 2019-05-30 DIAGNOSIS — R030 Elevated blood-pressure reading, without diagnosis of hypertension: Secondary | ICD-10-CM | POA: Diagnosis not present

## 2019-05-30 LAB — CBC WITH DIFFERENTIAL/PLATELET
Abs Immature Granulocytes: 0.02 10*3/uL (ref 0.00–0.07)
Basophils Absolute: 0.1 10*3/uL (ref 0.0–0.1)
Basophils Relative: 1 %
Eosinophils Absolute: 0 10*3/uL (ref 0.0–0.5)
Eosinophils Relative: 1 %
HCT: 40.8 % (ref 36.0–46.0)
Hemoglobin: 12.8 g/dL (ref 12.0–15.0)
Immature Granulocytes: 0 %
Lymphocytes Relative: 25 %
Lymphs Abs: 1.6 10*3/uL (ref 0.7–4.0)
MCH: 28.6 pg (ref 26.0–34.0)
MCHC: 31.4 g/dL (ref 30.0–36.0)
MCV: 91.1 fL (ref 80.0–100.0)
Monocytes Absolute: 0.6 10*3/uL (ref 0.1–1.0)
Monocytes Relative: 9 %
Neutro Abs: 4.2 10*3/uL (ref 1.7–7.7)
Neutrophils Relative %: 64 %
Platelets: 324 10*3/uL (ref 150–400)
RBC: 4.48 MIL/uL (ref 3.87–5.11)
RDW: 12.3 % (ref 11.5–15.5)
WBC: 6.6 10*3/uL (ref 4.0–10.5)
nRBC: 0 % (ref 0.0–0.2)

## 2019-05-30 LAB — IRON AND TIBC
Iron: 47 ug/dL (ref 28–170)
Saturation Ratios: 13 % (ref 10.4–31.8)
TIBC: 377 ug/dL (ref 250–450)
UIBC: 330 ug/dL

## 2019-05-30 LAB — FERRITIN: Ferritin: 10 ng/mL — ABNORMAL LOW (ref 11–307)

## 2019-06-01 ENCOUNTER — Inpatient Hospital Stay (HOSPITAL_BASED_OUTPATIENT_CLINIC_OR_DEPARTMENT_OTHER): Payer: Medicare Other | Admitting: Oncology

## 2019-06-01 ENCOUNTER — Other Ambulatory Visit: Payer: Self-pay

## 2019-06-01 ENCOUNTER — Inpatient Hospital Stay: Payer: Medicare Other

## 2019-06-01 ENCOUNTER — Encounter: Payer: Self-pay | Admitting: Oncology

## 2019-06-01 VITALS — BP 161/101 | HR 66 | Temp 97.2°F | Resp 16 | Wt 106.8 lb

## 2019-06-01 DIAGNOSIS — D500 Iron deficiency anemia secondary to blood loss (chronic): Secondary | ICD-10-CM

## 2019-06-01 DIAGNOSIS — R519 Headache, unspecified: Secondary | ICD-10-CM | POA: Diagnosis not present

## 2019-06-01 DIAGNOSIS — N939 Abnormal uterine and vaginal bleeding, unspecified: Secondary | ICD-10-CM

## 2019-06-01 DIAGNOSIS — R030 Elevated blood-pressure reading, without diagnosis of hypertension: Secondary | ICD-10-CM | POA: Diagnosis not present

## 2019-06-01 NOTE — Progress Notes (Signed)
Hematology/Oncology follow up note [facility] [phone] [phone]   Patient Care Team: [name] as PCP - General (Family Medicine)  REFERRING PROVIDER: Mar Daring, Mamie Nick* CHIEF COMPLAINTS/REASON FOR VISIT:  Follow up for iron deficiency anemia  HISTORY OF PRESENTING ILLNESS:  Susan Howard is a  49 y.o.  female with PMH listed below was seen in consultation at the request of Mar Daring, P*   for evaluation of iron deficiency anemia.   Reviewed patient's recent labs  11/04/2018 labs revealed anemia with hemoglobin of 6.8, MCV 64, platelet count of 294,000, WBC 6. Normal CMP Iron panel was checked on 11/04/2018.  Iron saturation 3, ferritin 3.  TIBC 374. No other recent hemoglobin level.  Hemoglobin in 2017 was 11.3.  MCV 82.  Patient reports history of heavy peri-menopausal bleeding.  A while ago, patient had nonstop bleeding for 6 weeks.  He contacted his GYN provider and was prescribed to use Provera. 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 Patient is very athletic.  She continues to run and bike.  She has experienced decrease of exercise duration likely.  However, she continues to exercise heavily as she has been used to doing exercise and is difficult for her to stop.  She tells me that she wore cast during her childhood due to spina bifida and she swore to herself that if she were able to read off the cast, she would do not stop running" She has been advised to start oral iron supplementations.  She took iron supplementation for short term of.  And stopped as she is not a medicine taker. Patient works as a Marine scientist at [employer]  Patient has received IV Venofer treatments with good hematological response and improve of her symptoms.  INTERVAL HISTORY Susan Howard is a 49 y.o. female who has above history reviewed by me today presents for follow up  visit for management of iron deficiency anemia Problems and complaints are listed below: Patient presents for follow-up for iron deficiency anemia. During the interval, patient was started on oral contraceptive pills to control vaginal bleeding. Patient also underwent endometrial ablation, thermal cystourethroscopy, cervicectomy, cystoscopy ureteroscopy.  On 05/17/2019. Patient reports that she also has been taking Vitron C iron supplementation until 2 weeks prior to her procedure. She continues to have vaginal bleeding today.  Her blood pressure has been high so oral contraceptive pills have been also discontinued recently.  Today in the clinic, her blood pressure was high.  She says she measures her blood pressure at home quite often and her systolic blood pressure usually is around 150s.  Occasionally she has headache.  No focal deficits.  Review of Systems  Constitutional: Negative for appetite change, chills, fatigue and fever.  HENT:   Negative for hearing loss and voice change.   Eyes: Negative for eye problems.  Respiratory: Negative for chest tightness and cough.   Cardiovascular: Negative for chest pain.  Gastrointestinal: Negative for abdominal distention, abdominal pain and blood in stool.  Endocrine: Negative for hot flashes.  Genitourinary: Negative for difficulty urinating and frequency.        Status post recent endometrial ablation.  Continue to have vaginal bleeding  Musculoskeletal: Negative for arthralgias.  Skin: Negative for itching and rash.  Neurological: Negative for extremity weakness.  Hematological: Negative for adenopathy.  Psychiatric/Behavioral: Negative for confusion.    MEDICAL HISTORY:  Past Medical History:  Diagnosis Date  .  Decubitus ulcer of buttock, stage 2 ([place])   . Family history of breast cancer    declined BRCA testing 2013  . Iron deficiency anemia due to chronic blood loss 11/09/2018  . MRSA (methicillin resistant Staphylococcus aureus)     . Spina bifida ([place])   . Status post implantation of artificial urinary sphincter 2006   neurogenic bladder  . UTI (urinary tract infection) 12/2016    SURGICAL HISTORY: Past Surgical History:  Procedure Laterality Date  . BACK SURGERY     for spinal bifida  . CESAREAN SECTION  [id number]   G4P4  . 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 HIP SURGERY  2012   [facility]  Center-transfer of muscle from left buttock  . TUBAL LIGATION  1996  . URINARY SPHINCTER REVISION  10/18/2016   urinary sphincter prosthesis for urinary incontinence with multiple revisions. Last revision 09/2014    SOCIAL HISTORY: Social History   Socioeconomic History  . Marital status: Married    Spouse name: Not on file  . Number of children: 4  . Years of education: Bachelor's  . Highest education level: Not on file  Occupational History  . Occupation: Therapist, sports  Tobacco Use  . Smoking status: Never Smoker  . Smokeless tobacco: Never Used  Substance and Sexual Activity  . Alcohol use: No    Alcohol/week: 0.0 standard drinks  . Drug use: No  . Sexual activity: Yes    Partners: Male    Birth control/protection: Surgical    Comment: tubal ligation  Other Topics Concern  . Not on file  Social History Narrative  . Not on file   Social Determinants of Health   Financial Resource Strain:   . Difficulty of Paying Living Expenses: Not on file  Food Insecurity:   . Worried About Charity fundraiser in the Last Year: Not on file  . Ran Out of Food in the Last Year: Not on file  Transportation Needs:   . Lack of Transportation (Medical): Not on file  . Lack of Transportation (Non-Medical): Not on file  Physical Activity:   . Days of Exercise per Week: Not on file  . Minutes of Exercise per Session: Not on file  Stress:   . Feeling of Stress : Not on file  Social Connections:   . Frequency of Communication with Friends and Family: Not on file  . Frequency of Social  Gatherings with Friends and Family: Not on file  . Attends Religious Services: Not on file  . Active Member of Clubs or Organizations: Not on file  . Attends Archivist Meetings: Not on file  . Marital Status: Not on file  Intimate Partner Violence:   . Fear of Current or Ex-Partner: Not on file  . Emotionally Abused: Not on file  . Physically Abused: Not on file  . Sexually Abused: Not on file    FAMILY HISTORY: Family History  Problem Relation Age of Onset  . Breast cancer Mother 6  . Hypertension Mother   . Leukemia Father 6  . Neural tube defect Son        spinal bifida  . Breast cancer Maternal Grandmother 56  . Hypertension Maternal Grandmother   . Heart disease Maternal Grandfather   . Heart attack Maternal Grandfather 55    ALLERGIES:  is allergic to latex; other; clindamycin/lincomycin; and septra [sulfamethoxazole-trimethoprim].  MEDICATIONS:  Current Outpatient Medications  Medication Sig Dispense Refill  .  Iron-Vitamin C (VITRON-C) 65-125 MG TABS Take by mouth.     No current facility-administered medications for this visit.     PHYSICAL EXAMINATION: ECOG PERFORMANCE STATUS: 0 - Asymptomatic Vitals:   06/01/19 1255  BP: (!) 161/101  Pulse: 66  Resp: 16  Temp: (!) 97.2 F (36.2 C)   Filed Weights   06/01/19 1255  Weight: 106 lb 12.8 oz (48.4 kg)    Physical Exam Constitutional:      General: She is not in acute distress. HENT:     Head: Normocephalic and atraumatic.  Eyes:     General: No scleral icterus.    Pupils: Pupils are equal, round, and reactive to light.  Cardiovascular:     Rate and Rhythm: Normal rate and regular rhythm.     Heart sounds: Normal heart sounds.  Pulmonary:     Effort: Pulmonary effort is normal. No respiratory distress.     Breath sounds: No wheezing.  Abdominal:     General: Bowel sounds are normal. There is no distension.     Palpations: Abdomen is soft. There is no mass.     Tenderness: There is  no abdominal tenderness.  Musculoskeletal:        General: No deformity. Normal range of motion.     Cervical back: Normal range of motion and neck supple.  Skin:    General: Skin is warm and dry.     Coloration: Skin is not pale.     Findings: No erythema or rash.  Neurological:     Mental Status: She is alert and oriented to person, place, and time. Mental status is at baseline.     Cranial Nerves: No cranial nerve deficit.     Coordination: Coordination normal.  Psychiatric:        Mood and Affect: Mood normal.        Behavior: Behavior normal.        Thought Content: Thought content normal.       CMP Latest Ref Rng & Units 11/04/2018  Glucose 65 - 99 mg/dL 89  BUN 6 - 24 mg/dL 11  Creatinine 0.57 - 1.00 mg/dL 0.82  Sodium 134 - 144 mmol/L 140  Potassium 3.5 - 5.2 mmol/L 4.4  Chloride 96 - 106 mmol/L 105  CO2 20 - 29 mmol/L 21  Calcium 8.7 - 10.2 mg/dL 9.2  Total Protein 6.0 - 8.5 g/dL 6.6  Total Bilirubin 0.0 - 1.2 mg/dL 0.2  Alkaline Phos 39 - 117 IU/L 45  AST 0 - 40 IU/L 18  ALT 0 - 32 IU/L 11   CBC Latest Ref Rng & Units 05/30/2019  WBC 4.0 - 10.5 K/uL 6.6  Hemoglobin 12.0 - 15.0 g/dL 12.8  Hematocrit 36.0 - 46.0 % 40.8  Platelets 150 - 400 K/uL 324     LABORATORY DATA:  I have reviewed the data as listed Lab Results  Component Value Date   WBC 6.6 05/30/2019   HGB 12.8 05/30/2019   HCT 40.8 05/30/2019   MCV 91.1 05/30/2019   PLT 324 05/30/2019   Recent Labs    11/04/18 1024  NA 140  K 4.4  CL 105  CO2 21  GLUCOSE 89  BUN 11  CREATININE 0.82  CALCIUM 9.2  GFRNONAA 85  GFRAA 98  PROT 6.6  ALBUMIN 4.3  AST 18  ALT 11  ALKPHOS 45  BILITOT 0.2   Iron/TIBC/Ferritin/ %Sat    Component Value Date/Time   IRON 47 05/30/2019 1107  IRON 11 (L) 11/04/2018 1024   TIBC 377 05/30/2019 1107   TIBC 374 11/04/2018 1024   FERRITIN 10 (L) 05/30/2019 1107   FERRITIN 3 (L) 11/04/2018 1024   IRONPCTSAT 13 05/30/2019 1107   IRONPCTSAT 3 (LL) 11/04/2018  1024     No results found.    ASSESSMENT & PLAN:  1. Iron deficiency anemia due to chronic blood loss   2. Vaginal bleeding   3. Elevated blood pressure reading    #Labs were reviewed by me discussed with patient. Hemoglobin remains normal. Iron panel shows ferritin of 10, decreased from prior visit. Consistent with iron deficiency. Given that patient still has ongoing vaginal bleeding, pending additional GYN evaluation and management, I recommend patient to proceed with Venofer 200 mg x 1. We cannot give it to her today due to her elevated blood pressure. Advised patient to reschedule to another time for IV Venofer. Recommend patient to resume Vitron C.   Elevated blood pressure, Onset of high BP since oral contraceptive pills which have now been discontinued. Patient is to have elevated blood pressure.  Advised patient to monitor her blood pressure at home. If is persistently high, she may benefit from antihypertensives. Follow-up in 4 months.  Orders Placed This Encounter  Procedures  . CBC with Differential/Platelet    Standing Status:   Future    Standing Expiration Date:   05/31/2020  . Iron and TIBC    Standing Status:   Future    Standing Expiration Date:   05/31/2020  . Ferritin    Standing Status:   Future    Standing Expiration Date:   05/31/2020    All questions were answered. The patient knows to call the clinic with any problems questions or concerns.  Cc Mar Daring, [place]*  Return of visit: 4 months     Earlie Server, MD, PhD Hematology Oncology [facility] Pager- [phone] 06/01/2019

## 2019-06-01 NOTE — Progress Notes (Signed)
Patient was started on birth control pill after surgery but had to stop due to increase in BP.

## 2019-06-08 ENCOUNTER — Other Ambulatory Visit: Payer: Self-pay

## 2019-06-08 DIAGNOSIS — C4491 Basal cell carcinoma of skin, unspecified: Secondary | ICD-10-CM | POA: Diagnosis not present

## 2019-06-09 ENCOUNTER — Other Ambulatory Visit: Payer: Self-pay

## 2019-06-09 ENCOUNTER — Inpatient Hospital Stay: Payer: Medicare Other

## 2019-06-09 VITALS — BP 128/90 | HR 67 | Temp 96.8°F | Resp 17

## 2019-06-09 DIAGNOSIS — R030 Elevated blood-pressure reading, without diagnosis of hypertension: Secondary | ICD-10-CM | POA: Diagnosis not present

## 2019-06-09 DIAGNOSIS — R519 Headache, unspecified: Secondary | ICD-10-CM | POA: Diagnosis not present

## 2019-06-09 DIAGNOSIS — D500 Iron deficiency anemia secondary to blood loss (chronic): Secondary | ICD-10-CM | POA: Diagnosis not present

## 2019-06-09 MED ORDER — SODIUM CHLORIDE 0.9 % IV SOLN
Freq: Once | INTRAVENOUS | Status: AC
  Filled 2019-06-09: qty 250

## 2019-06-09 MED ORDER — IRON SUCROSE 20 MG/ML IV SOLN
200.0000 mg | Freq: Once | INTRAVENOUS | Status: AC
  Administered 2019-06-09: 200 mg via INTRAVENOUS
  Filled 2019-06-09: qty 10

## 2019-06-09 NOTE — Progress Notes (Signed)
1319: B/P 141/102 1322: B/P 170/92 Pt denies any concerns or symptoms at this time. Pt states when she tooker her b/p at home this morning it was 117/85, pt reports "it has not been high since Sunday". Pt states "I feel much better than I did".  MD aware. Per Dr. Tasia Catchings okay to proceed with Venofer treatment and pt to continue to monitor B/P at home. Pt verbalizes understanding and agrees with plan.   1400: Pt tolerated infusion well. Pt and VS stable at discharge. No s/s of distress noted.

## 2019-07-13 DIAGNOSIS — C44311 Basal cell carcinoma of skin of nose: Secondary | ICD-10-CM | POA: Diagnosis not present

## 2019-07-13 DIAGNOSIS — C44319 Basal cell carcinoma of skin of other parts of face: Secondary | ICD-10-CM | POA: Diagnosis not present

## 2019-07-14 DIAGNOSIS — C44311 Basal cell carcinoma of skin of nose: Secondary | ICD-10-CM | POA: Diagnosis not present

## 2019-07-14 DIAGNOSIS — C44319 Basal cell carcinoma of skin of other parts of face: Secondary | ICD-10-CM | POA: Diagnosis not present

## 2019-07-15 DIAGNOSIS — C44311 Basal cell carcinoma of skin of nose: Secondary | ICD-10-CM | POA: Diagnosis not present

## 2019-07-15 DIAGNOSIS — C44319 Basal cell carcinoma of skin of other parts of face: Secondary | ICD-10-CM | POA: Diagnosis not present

## 2019-07-18 DIAGNOSIS — C44311 Basal cell carcinoma of skin of nose: Secondary | ICD-10-CM | POA: Diagnosis not present

## 2019-07-18 DIAGNOSIS — C44319 Basal cell carcinoma of skin of other parts of face: Secondary | ICD-10-CM | POA: Diagnosis not present

## 2019-07-20 DIAGNOSIS — C44319 Basal cell carcinoma of skin of other parts of face: Secondary | ICD-10-CM | POA: Diagnosis not present

## 2019-07-20 DIAGNOSIS — C44311 Basal cell carcinoma of skin of nose: Secondary | ICD-10-CM | POA: Diagnosis not present

## 2019-07-21 DIAGNOSIS — C44311 Basal cell carcinoma of skin of nose: Secondary | ICD-10-CM | POA: Diagnosis not present

## 2019-07-21 DIAGNOSIS — C44319 Basal cell carcinoma of skin of other parts of face: Secondary | ICD-10-CM | POA: Diagnosis not present

## 2019-07-24 DIAGNOSIS — C44319 Basal cell carcinoma of skin of other parts of face: Secondary | ICD-10-CM | POA: Diagnosis not present

## 2019-07-24 DIAGNOSIS — C44311 Basal cell carcinoma of skin of nose: Secondary | ICD-10-CM | POA: Diagnosis not present

## 2019-07-25 DIAGNOSIS — C44311 Basal cell carcinoma of skin of nose: Secondary | ICD-10-CM | POA: Diagnosis not present

## 2019-07-25 DIAGNOSIS — C44319 Basal cell carcinoma of skin of other parts of face: Secondary | ICD-10-CM | POA: Diagnosis not present

## 2019-07-27 DIAGNOSIS — C44311 Basal cell carcinoma of skin of nose: Secondary | ICD-10-CM | POA: Diagnosis not present

## 2019-07-27 DIAGNOSIS — C44319 Basal cell carcinoma of skin of other parts of face: Secondary | ICD-10-CM | POA: Diagnosis not present

## 2019-07-28 DIAGNOSIS — C44311 Basal cell carcinoma of skin of nose: Secondary | ICD-10-CM | POA: Diagnosis not present

## 2019-07-28 DIAGNOSIS — C44319 Basal cell carcinoma of skin of other parts of face: Secondary | ICD-10-CM | POA: Diagnosis not present

## 2019-08-01 DIAGNOSIS — C44311 Basal cell carcinoma of skin of nose: Secondary | ICD-10-CM | POA: Diagnosis not present

## 2019-08-01 DIAGNOSIS — C44319 Basal cell carcinoma of skin of other parts of face: Secondary | ICD-10-CM | POA: Diagnosis not present

## 2019-08-03 DIAGNOSIS — C44319 Basal cell carcinoma of skin of other parts of face: Secondary | ICD-10-CM | POA: Diagnosis not present

## 2019-08-03 DIAGNOSIS — C44311 Basal cell carcinoma of skin of nose: Secondary | ICD-10-CM | POA: Diagnosis not present

## 2019-08-04 DIAGNOSIS — C44319 Basal cell carcinoma of skin of other parts of face: Secondary | ICD-10-CM | POA: Diagnosis not present

## 2019-08-04 DIAGNOSIS — C44311 Basal cell carcinoma of skin of nose: Secondary | ICD-10-CM | POA: Diagnosis not present

## 2019-08-06 DIAGNOSIS — C44311 Basal cell carcinoma of skin of nose: Secondary | ICD-10-CM | POA: Diagnosis not present

## 2019-08-06 DIAGNOSIS — C44319 Basal cell carcinoma of skin of other parts of face: Secondary | ICD-10-CM | POA: Diagnosis not present

## 2019-08-08 DIAGNOSIS — C44311 Basal cell carcinoma of skin of nose: Secondary | ICD-10-CM | POA: Diagnosis not present

## 2019-08-08 DIAGNOSIS — C44319 Basal cell carcinoma of skin of other parts of face: Secondary | ICD-10-CM | POA: Diagnosis not present

## 2019-08-11 DIAGNOSIS — C44311 Basal cell carcinoma of skin of nose: Secondary | ICD-10-CM | POA: Diagnosis not present

## 2019-08-11 DIAGNOSIS — C44319 Basal cell carcinoma of skin of other parts of face: Secondary | ICD-10-CM | POA: Diagnosis not present

## 2019-08-12 DIAGNOSIS — C44311 Basal cell carcinoma of skin of nose: Secondary | ICD-10-CM | POA: Diagnosis not present

## 2019-08-12 DIAGNOSIS — C44319 Basal cell carcinoma of skin of other parts of face: Secondary | ICD-10-CM | POA: Diagnosis not present

## 2019-08-15 DIAGNOSIS — C44311 Basal cell carcinoma of skin of nose: Secondary | ICD-10-CM | POA: Diagnosis not present

## 2019-08-15 DIAGNOSIS — C44319 Basal cell carcinoma of skin of other parts of face: Secondary | ICD-10-CM | POA: Diagnosis not present

## 2019-08-18 DIAGNOSIS — C44319 Basal cell carcinoma of skin of other parts of face: Secondary | ICD-10-CM | POA: Diagnosis not present

## 2019-08-18 DIAGNOSIS — C44311 Basal cell carcinoma of skin of nose: Secondary | ICD-10-CM | POA: Diagnosis not present

## 2019-08-20 DIAGNOSIS — C44319 Basal cell carcinoma of skin of other parts of face: Secondary | ICD-10-CM | POA: Diagnosis not present

## 2019-08-20 DIAGNOSIS — C44311 Basal cell carcinoma of skin of nose: Secondary | ICD-10-CM | POA: Diagnosis not present

## 2019-08-22 DIAGNOSIS — C44311 Basal cell carcinoma of skin of nose: Secondary | ICD-10-CM | POA: Diagnosis not present

## 2019-08-22 DIAGNOSIS — C44319 Basal cell carcinoma of skin of other parts of face: Secondary | ICD-10-CM | POA: Diagnosis not present

## 2019-08-24 DIAGNOSIS — C44319 Basal cell carcinoma of skin of other parts of face: Secondary | ICD-10-CM | POA: Diagnosis not present

## 2019-08-24 DIAGNOSIS — C44311 Basal cell carcinoma of skin of nose: Secondary | ICD-10-CM | POA: Diagnosis not present

## 2019-08-25 DIAGNOSIS — C44319 Basal cell carcinoma of skin of other parts of face: Secondary | ICD-10-CM | POA: Diagnosis not present

## 2019-08-25 DIAGNOSIS — C44311 Basal cell carcinoma of skin of nose: Secondary | ICD-10-CM | POA: Diagnosis not present

## 2019-08-31 DIAGNOSIS — C44311 Basal cell carcinoma of skin of nose: Secondary | ICD-10-CM | POA: Diagnosis not present

## 2019-08-31 DIAGNOSIS — C44319 Basal cell carcinoma of skin of other parts of face: Secondary | ICD-10-CM | POA: Diagnosis not present

## 2019-09-04 DIAGNOSIS — C44319 Basal cell carcinoma of skin of other parts of face: Secondary | ICD-10-CM | POA: Diagnosis not present

## 2019-09-04 DIAGNOSIS — C44311 Basal cell carcinoma of skin of nose: Secondary | ICD-10-CM | POA: Diagnosis not present

## 2019-09-21 NOTE — Progress Notes (Signed)
I,Laura E Walsh,acting as a scribe for Lavon Paganini, MD.,have documented all relevant documentation on the behalf of Lavon Paganini, MD,as directed by  Lavon Paganini, MD while in the presence of Lavon Paganini, MD.    Established patient visit   Patient: Susan Howard   DOB: 12/26/1970   49 y.o. Female  MRN: [id number] Visit Date: 09/22/2019  Today's healthcare provider: Lavon Paganini, MD   Chief Complaint  Patient presents with  . Urinary Tract Infection   Subjective    HPI Urinary symptoms  She reports new onset urinary frequency and urinary urgency. The current episode started a few weeks ago and is gradually improving. Patient states symptoms are mild in intensity, occurring constantly. She  has been recently treated for similar symptoms.    Associated symptoms: No abdominal pain No back pain  No chills No constipation  No cramping No diarrhea  No discharge No fever  No hematuria No nausea  No vomiting    ---------------------------------------------------------------------------------------    Patient Active Problem List   Diagnosis Date Noted  . Acute cystitis without hematuria 09/22/2019  . Menometrorrhagia 01/30/2019  . Iron deficiency anemia due to chronic blood loss 11/09/2018  . Bursitis of shoulder 11/08/2018  . Family history of breast cancer 01/20/2017  . History of methicillin resistant Staphylococcus aureus infection 12/06/2014  . Spina bifida of lumbar region [facility]) 10/11/2014   Past Medical History:  Diagnosis Date  . Decubitus ulcer of buttock, stage 2 ([place])   . Family history of breast cancer    declined BRCA testing 2013  . Iron deficiency anemia due to chronic blood loss 11/09/2018  . MRSA (methicillin resistant Staphylococcus aureus)   . Spina bifida ([place])   . Status post implantation of artificial urinary sphincter 2006   neurogenic bladder  . UTI (urinary tract infection) 12/2016   Social History   Tobacco Use    . Smoking status: Never Smoker  . Smokeless tobacco: Never Used  Substance Use Topics  . Alcohol use: No    Alcohol/week: 0.0 standard drinks  . Drug use: No   Allergies  Allergen Reactions  . Latex Other (See Comments) and Rash    Respiratory distress: patient allergic to ALL LATEX  . Other Itching and Other (See Comments)    Allergen - (blue) chux pads Reaction - sneezing Allergen - (blue) chux pads Reaction - sneezing Allergen - chlorine Reaction - sneezing   . Clindamycin/Lincomycin Rash  . Septra [Sulfamethoxazole-Trimethoprim] Rash     Medications: Outpatient Medications Prior to Visit  Medication Sig  . Iron-Vitamin C (VITRON-C) 65-125 MG TABS Take by mouth.   No facility-administered medications prior to visit.    Review of Systems  Constitutional: Negative.   Gastrointestinal: Negative.   Genitourinary: Positive for dysuria, frequency and urgency. Negative for difficulty urinating, hematuria, vaginal bleeding, vaginal discharge and vaginal pain.  Musculoskeletal: Negative for back pain.      Objective    BP (!) 151/94 (BP Location: Left Arm, Patient Position: Sitting, Cuff Size: Normal)   Pulse 74   Temp (!) 96.6 F (35.9 C) (Temporal)   Wt 108 lb (49 kg)   BMI 20.41 kg/m    Physical Exam Constitutional:      Appearance: Normal appearance.  HENT:     Head: Normocephalic and atraumatic.  Eyes:     Conjunctiva/sclera: Conjunctivae normal.  Cardiovascular:     Rate and Rhythm: Normal rate and regular rhythm.     Pulses: Normal pulses.  Heart sounds: Normal heart sounds. No murmur.  Pulmonary:     Effort: Pulmonary effort is normal. No respiratory distress.     Breath sounds: Normal breath sounds. No wheezing or rhonchi.  Abdominal:     Palpations: Abdomen is soft.     Tenderness: There is no abdominal tenderness. There is no right CVA tenderness, left CVA tenderness or guarding.  Neurological:     Mental Status: She is alert and oriented  to person, place, and time. Mental status is at baseline.  Psychiatric:        Mood and Affect: Mood normal.        Behavior: Behavior normal.        Thought Content: Thought content normal.       Results for orders placed or performed in visit on 09/22/19  POCT urinalysis dipstick  Result Value Ref Range   Color, UA     Clarity, UA     Glucose, UA Negative Negative   Bilirubin, UA Negative    Ketones, UA Negative    Spec Grav, UA 1.015 1.010 - 1.025   Blood, UA Negative    pH, UA 6.0 5.0 - 8.0   Protein, UA Positive (A) Negative   Urobilinogen, UA 0.2 0.2 or 1.0 E.U./dL   Nitrite, UA Positive    Leukocytes, UA Trace (A) Negative   Appearance     Odor      Assessment & Plan     Problem List Items Addressed This Visit      Genitourinary   Acute cystitis without hematuria - Primary    - Symptoms and UA consistent with UTI -No systemic symptoms or signs of pyelonephritis Failed long course of Macrobid - patient self-administered -Will start treatment with 5day course of Cipro after reviewing previous urine culture  -We will send urine culture to confirm sensitivities -Discussed return precautions       Relevant Orders   POCT urinalysis dipstick (Completed)   Urine Culture       Return if symptoms worsen or fail to improve.      I, Lavon Paganini, MD, have reviewed all documentation for this visit. The documentation on 09/22/19 for the exam, diagnosis, procedures, and orders are all accurate and complete.   Gaje Tennyson, Dionne Bucy, MD, MPH [facility]

## 2019-09-22 ENCOUNTER — Other Ambulatory Visit: Payer: Self-pay

## 2019-09-22 ENCOUNTER — Ambulatory Visit (INDEPENDENT_AMBULATORY_CARE_PROVIDER_SITE_OTHER): Payer: Medicare Other | Admitting: Family Medicine

## 2019-09-22 ENCOUNTER — Encounter: Payer: Self-pay | Admitting: Family Medicine

## 2019-09-22 VITALS — BP 151/94 | HR 74 | Temp 96.6°F | Wt 108.0 lb

## 2019-09-22 DIAGNOSIS — N3000 Acute cystitis without hematuria: Secondary | ICD-10-CM

## 2019-09-22 DIAGNOSIS — N3001 Acute cystitis with hematuria: Secondary | ICD-10-CM | POA: Insufficient documentation

## 2019-09-22 LAB — POCT URINALYSIS DIPSTICK
Bilirubin, UA: NEGATIVE
Blood, UA: NEGATIVE
Glucose, UA: NEGATIVE
Ketones, UA: NEGATIVE
Nitrite, UA: POSITIVE
Protein, UA: POSITIVE — AB
Spec Grav, UA: 1.015 (ref 1.010–1.025)
Urobilinogen, UA: 0.2 E.U./dL
pH, UA: 6 (ref 5.0–8.0)

## 2019-09-22 MED ORDER — CIPROFLOXACIN HCL 500 MG PO TABS: 500.0000 mg | ORAL_TABLET | Freq: Two times a day (BID) | ORAL | 0 refills | Status: DC

## 2019-09-22 NOTE — Patient Instructions (Signed)
Urinary Tract Infection, Adult A urinary tract infection (UTI) is an infection of any part of the urinary tract. The urinary tract includes:  The kidneys.  The ureters.  The bladder.  The urethra. These organs make, store, and get rid of pee (urine) in the body. What are the causes? This is caused by germs (bacteria) in your genital area. These germs grow and cause swelling (inflammation) of your urinary tract. What increases the risk? You are more likely to develop this condition if:  You have a small, thin tube (catheter) to drain pee.  You cannot control when you pee or poop (incontinence).  You are female, and: ? You use these methods to prevent pregnancy:  A medicine that kills sperm (spermicide).  A device that blocks sperm (diaphragm). ? You have low levels of a female hormone (estrogen). ? You are pregnant.  You have genes that add to your risk.  You are sexually active.  You take antibiotic medicines.  You have trouble peeing because of: ? A prostate that is bigger than normal, if you are female. ? A blockage in the part of your body that drains pee from the bladder (urethra). ? A kidney stone. ? A nerve condition that affects your bladder (neurogenic bladder). ? Not getting enough to drink. ? Not peeing often enough.  You have other conditions, such as: ? Diabetes. ? A weak disease-fighting system (immune system). ? Sickle cell disease. ? Gout. ? Injury of the spine. What are the signs or symptoms? Symptoms of this condition include:  Needing to pee right away (urgently).  Peeing often.  Peeing small amounts often.  Pain or burning when peeing.  Blood in the pee.  Pee that smells bad or not like normal.  Trouble peeing.  Pee that is cloudy.  Fluid coming from the vagina, if you are female.  Pain in the belly or lower back. Other symptoms include:  Throwing up (vomiting).  No urge to eat.  Feeling mixed up (confused).  Being tired  and grouchy (irritable).  A fever.  Watery poop (diarrhea). How is this treated? This condition may be treated with:  Antibiotic medicine.  Other medicines.  Drinking enough water. Follow these instructions at home:  Medicines  Take over-the-counter and prescription medicines only as told by your doctor.  If you were prescribed an antibiotic medicine, take it as told by your doctor. Do not stop taking it even if you start to feel better. General instructions  Make sure you: ? Pee until your bladder is empty. ? Do not hold pee for a long time. ? Empty your bladder after sex. ? Wipe from front to back after pooping if you are a female. Use each tissue one time when you wipe.  Drink enough fluid to keep your pee pale yellow.  Keep all follow-up visits as told by your doctor. This is important. Contact a doctor if:  You do not get better after 1-2 days.  Your symptoms go away and then come back. Get help right away if:  You have very bad back pain.  You have very bad pain in your lower belly.  You have a fever.  You are sick to your stomach (nauseous).  You are throwing up. Summary  A urinary tract infection (UTI) is an infection of any part of the urinary tract.  This condition is caused by germs in your genital area.  There are many risk factors for a UTI. These include having a small, thin   tube to drain pee and not being able to control when you pee or poop.  Treatment includes antibiotic medicines for germs.  Drink enough fluid to keep your pee pale yellow. This information is not intended to replace advice given to you by your health care provider. Make sure you discuss any questions you have with your health care provider. Document Revised: 03/25/2018 Document Reviewed: 10/15/2017 Elsevier Patient Education  2020 [employer]vier Inc.  

## 2019-09-22 NOTE — Assessment & Plan Note (Addendum)
-   Symptoms and UA consistent with UTI -No systemic symptoms or signs of pyelonephritis Failed long course of Macrobid - patient self-administered -Will start treatment with 5day course of Cipro after reviewing previous urine culture  -We will send urine culture to confirm sensitivities -Discussed return precautions

## 2019-09-24 LAB — URINE CULTURE

## 2019-09-26 ENCOUNTER — Telehealth: Payer: Self-pay

## 2019-09-26 NOTE — Telephone Encounter (Signed)
Result Communications   Result Notes and Comments to Patient Comment seen by patient Ricci Barker on 09/26/2019 8:29 AM EDT

## 2019-09-26 NOTE — Telephone Encounter (Signed)
-----   Message from Virginia Crews, MD sent at 09/26/2019  8:27 AM EDT ----- Urine culture confirms UTI that is sensitive to abx prescribed.  Hope she is doing better.

## 2019-09-28 ENCOUNTER — Inpatient Hospital Stay (HOSPITAL_BASED_OUTPATIENT_CLINIC_OR_DEPARTMENT_OTHER): Payer: Medicare Other | Admitting: Oncology

## 2019-09-28 ENCOUNTER — Other Ambulatory Visit: Payer: Self-pay

## 2019-09-28 ENCOUNTER — Encounter: Payer: Self-pay | Admitting: Oncology

## 2019-09-28 ENCOUNTER — Inpatient Hospital Stay: Payer: Medicare Other | Attending: Oncology

## 2019-09-28 DIAGNOSIS — D500 Iron deficiency anemia secondary to blood loss (chronic): Secondary | ICD-10-CM | POA: Diagnosis not present

## 2019-09-28 LAB — IRON AND TIBC
Iron: 72 ug/dL (ref 28–170)
Saturation Ratios: 24 % (ref 10.4–31.8)
TIBC: 300 ug/dL (ref 250–450)
UIBC: 228 ug/dL

## 2019-09-28 LAB — CBC WITH DIFFERENTIAL/PLATELET
Abs Immature Granulocytes: 0.03 10*3/uL (ref 0.00–0.07)
Basophils Absolute: 0.1 10*3/uL (ref 0.0–0.1)
Basophils Relative: 1 %
Eosinophils Absolute: 0 10*3/uL (ref 0.0–0.5)
Eosinophils Relative: 0 %
HCT: 44 % (ref 36.0–46.0)
Hemoglobin: 14.9 g/dL (ref 12.0–15.0)
Immature Granulocytes: 0 %
Lymphocytes Relative: 18 %
Lymphs Abs: 1.6 10*3/uL (ref 0.7–4.0)
MCH: 30.1 pg (ref 26.0–34.0)
MCHC: 33.9 g/dL (ref 30.0–36.0)
MCV: 88.9 fL (ref 80.0–100.0)
Monocytes Absolute: 0.8 10*3/uL (ref 0.1–1.0)
Monocytes Relative: 9 %
Neutro Abs: 6.7 10*3/uL (ref 1.7–7.7)
Neutrophils Relative %: 72 %
Platelets: 237 10*3/uL (ref 150–400)
RBC: 4.95 MIL/uL (ref 3.87–5.11)
RDW: 13.6 % (ref 11.5–15.5)
WBC: 9.3 10*3/uL (ref 4.0–10.5)
nRBC: 0 % (ref 0.0–0.2)

## 2019-09-28 LAB — FERRITIN: Ferritin: 15 ng/mL (ref 11–307)

## 2019-09-28 NOTE — Progress Notes (Signed)
HEMATOLOGY-ONCOLOGY TeleHEALTH VISIT PROGRESS NOTE  I connected with Susan Howard on 09/28/19 at  2:00 PM EDT by video enabled telemedicine visit and verified that I am speaking with the correct person using two identifiers. I discussed the limitations, risks, security and privacy concerns of performing an evaluation and management service by telemedicine and the availability of in-person appointments. I also discussed with the patient that there may be a patient responsible charge related to this service. The patient expressed understanding and agreed to proceed.   Other persons participating in the visit and their role in the encounter:  None  Patient's location: Home  Provider's location: office Chief Complaint: IDA   INTERVAL HISTORY Susan Howard is a 49 y.o. female who has above history reviewed by me today presents for follow up visit for management of IDA Problems and complaints are listed below:  S/p IV venofer.  She feels fatigue has improved.  She does not have additional episodes of heavy menstrual bleeding.   Review of Systems  Constitutional: Negative for appetite change, chills, fatigue and fever.  HENT:   Negative for hearing loss and voice change.   Eyes: Negative for eye problems.  Respiratory: Negative for chest tightness and cough.   Cardiovascular: Negative for chest pain.  Gastrointestinal: Negative for abdominal distention, abdominal pain and blood in stool.  Endocrine: Negative for hot flashes.  Genitourinary: Negative for difficulty urinating and frequency.   Musculoskeletal: Negative for arthralgias.  Skin: Negative for itching and rash.  Neurological: Negative for extremity weakness.  Hematological: Negative for adenopathy.  Psychiatric/Behavioral: Negative for confusion.    Past Medical History:  Diagnosis Date  . Decubitus ulcer of buttock, stage 2 ([place])   . Family history of breast cancer    declined BRCA testing 2013  . Iron deficiency anemia  due to chronic blood loss 11/09/2018  . MRSA (methicillin resistant Staphylococcus aureus)   . Spina bifida ([place])   . Status post implantation of artificial urinary sphincter 2006   neurogenic bladder  . UTI (urinary tract infection) 12/2016   Past Surgical History:  Procedure Laterality Date  . BACK SURGERY     for spinal bifida  . CESAREAN SECTION  [phone]   G4P4  . 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 HIP SURGERY  2012   [facility]  Center-transfer of muscle from left buttock  . TUBAL LIGATION  1996  . URINARY SPHINCTER REVISION  10/18/2016   urinary sphincter prosthesis for urinary incontinence with multiple revisions. Last revision 09/2014    Family History  Problem Relation Age of Onset  . Breast cancer Mother 70  . Hypertension Mother   . Leukemia Father 59  . Neural tube defect Son        spinal bifida  . Breast cancer Maternal Grandmother 41  . Hypertension Maternal Grandmother   . Heart disease Maternal Grandfather   . Heart attack Maternal Grandfather 74    Social History   Socioeconomic History  . Marital status: Married    Spouse name: Not on file  . Number of children: 4  . Years of education: Bachelor's  . Highest education level: Not on file  Occupational History  . Occupation: Therapist, sports  Tobacco Use  . Smoking status: Never Smoker  . Smokeless tobacco: Never Used  Substance and Sexual Activity  . Alcohol use: No    Alcohol/week: 0.0 standard drinks  . Drug use: No  . Sexual  activity: Yes    Partners: Male    Birth control/protection: Surgical    Comment: tubal ligation  Other Topics Concern  . Not on file  Social History Narrative  . Not on file   Social Determinants of Health   Financial Resource Strain:   . Difficulty of Paying Living Expenses:   Food Insecurity:   . Worried About Charity fundraiser in the Last Year:   . Arboriculturist in the Last Year:   Transportation Needs:   . Lexicographer (Medical):   Marland Kitchen Lack of Transportation (Non-Medical):   Physical Activity:   . Days of Exercise per Week:   . Minutes of Exercise per Session:   Stress:   . Feeling of Stress :   Social Connections:   . Frequency of Communication with Friends and Family:   . Frequency of Social Gatherings with Friends and Family:   . Attends Religious Services:   . Active Member of Clubs or Organizations:   . Attends Archivist Meetings:   Marland Kitchen Marital Status:   Intimate Partner Violence:   . Fear of Current or Ex-Partner:   . Emotionally Abused:   Marland Kitchen Physically Abused:   . Sexually Abused:     Current Outpatient Medications on File Prior to Visit  Medication Sig Dispense Refill  . Iron-Vitamin C (VITRON-C) 65-125 MG TABS Take by mouth.     No current facility-administered medications on file prior to visit.    Allergies  Allergen Reactions  . Latex Other (See Comments) and Rash    Respiratory distress: patient allergic to ALL LATEX  . Other Itching and Other (See Comments)    Allergen - (blue) chux pads Reaction - sneezing Allergen - (blue) chux pads Reaction - sneezing Allergen - chlorine Reaction - sneezing   . Clindamycin/Lincomycin Rash  . Septra [Sulfamethoxazole-Trimethoprim] Rash       Observations/Objective: There were no vitals filed for this visit. There is no height or weight on file to calculate BMI.  Physical Exam  Constitutional: No distress.  Neurological: She is alert.    CBC    Component Value Date/Time   WBC 9.3 09/28/2019 1044   RBC 4.95 09/28/2019 1044   HGB 14.9 09/28/2019 1044   HGB 6.8 (LL) 11/04/2018 1024   HCT 44.0 09/28/2019 1044   HCT 23.9 (L) 11/04/2018 1024   PLT 237 09/28/2019 1044   PLT 294 11/04/2018 1024   MCV 88.9 09/28/2019 1044   MCV 64 (L) 11/04/2018 1024   MCH 30.1 09/28/2019 1044   MCHC 33.9 09/28/2019 1044   RDW 13.6 09/28/2019 1044   RDW 18.1 (H) 11/04/2018 1024   LYMPHSABS 1.6 09/28/2019 1044    LYMPHSABS 1.4 11/04/2018 1024   MONOABS 0.8 09/28/2019 1044   EOSABS 0.0 09/28/2019 1044   EOSABS 0.0 11/04/2018 1024   BASOSABS 0.1 09/28/2019 1044   BASOSABS 0.1 11/04/2018 1024    CMP     Component Value Date/Time   NA 140 11/04/2018 1024   K 4.4 11/04/2018 1024   CL 105 11/04/2018 1024   CO2 21 11/04/2018 1024   GLUCOSE 89 11/04/2018 1024   BUN 11 11/04/2018 1024   CREATININE 0.82 11/04/2018 1024   CALCIUM 9.2 11/04/2018 1024   PROT 6.6 11/04/2018 1024   ALBUMIN 4.3 11/04/2018 1024   AST 18 11/04/2018 1024   ALT 11 11/04/2018 1024   ALKPHOS 45 11/04/2018 1024   BILITOT 0.2 11/04/2018 1024   GFRNONAA  85 11/04/2018 1024   GFRAA 98 11/04/2018 1024     Assessment and Plan: 1. Iron deficiency anemia due to chronic blood loss     Iron deficiency anemia secondary to blood loss. Labs reviewed and discussed with patient.  Hemoglobin stable at 14.9, ferritin has improved to 15, iron saturation 24. No need for additional IV iron treatments at this point.  Continue monitor.  Patient to follow-up next months. Also advised patient to have screening colonoscopy after she turns 49. Follow Up Instructions: 6 months   I discussed the assessment and treatment plan with the patient. The patient was provided an opportunity to ask questions and all were answered. The patient agreed with the plan and demonstrated an understanding of the instructions.  The patient was advised to call back or seek an in-person evaluation if the symptoms worsen or if the condition fails to improve as anticipated.    Earlie Server, MD 09/28/2019 9:49 PM

## 2019-09-28 NOTE — Progress Notes (Signed)
Pt contacted for follow up Mychart visit. No new concerns voiced.

## 2019-09-29 ENCOUNTER — Inpatient Hospital Stay: Payer: Medicare Other

## 2019-10-09 DIAGNOSIS — R300 Dysuria: Secondary | ICD-10-CM | POA: Diagnosis not present

## 2019-10-20 DIAGNOSIS — C44311 Basal cell carcinoma of skin of nose: Secondary | ICD-10-CM | POA: Diagnosis not present

## 2019-10-20 DIAGNOSIS — C44319 Basal cell carcinoma of skin of other parts of face: Secondary | ICD-10-CM | POA: Diagnosis not present

## 2019-11-11 IMAGING — US US THYROID
1 series · 14 of 25 positions shown · non-contrast
Comparison: None.

CLINICAL DATA: Hyperthyroidism

EXAM:
THYROID ULTRASOUND
TECHNIQUE: Ultrasound examination of the thyroid gland and adjacent soft
tissues was performed.

[Series 1: us thyroid · 0.07mm/px · 14 of 38 slices shown]
[im 1/38]
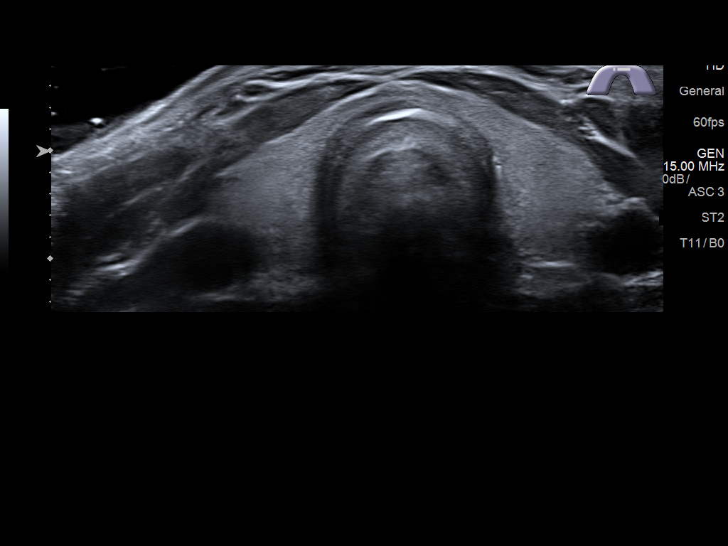
[im 4/38]
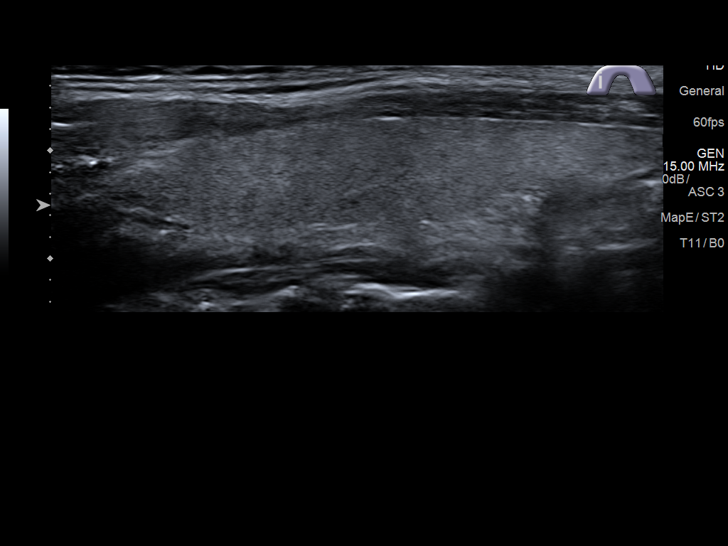
[im 7/38]
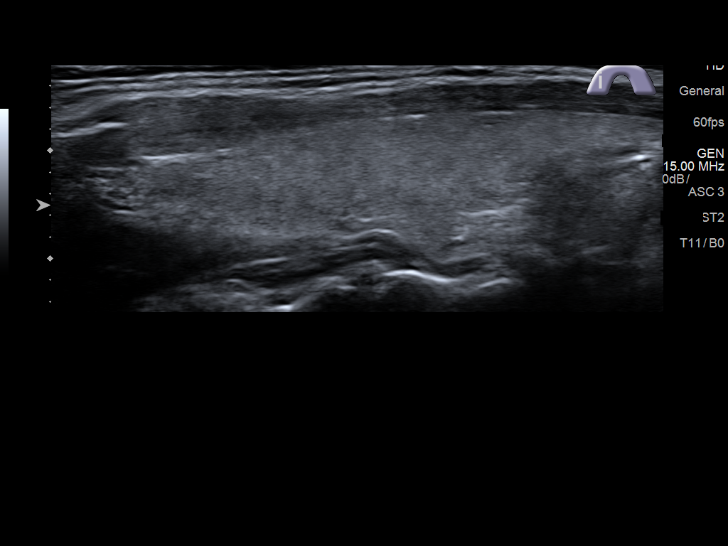
[im 10/38]
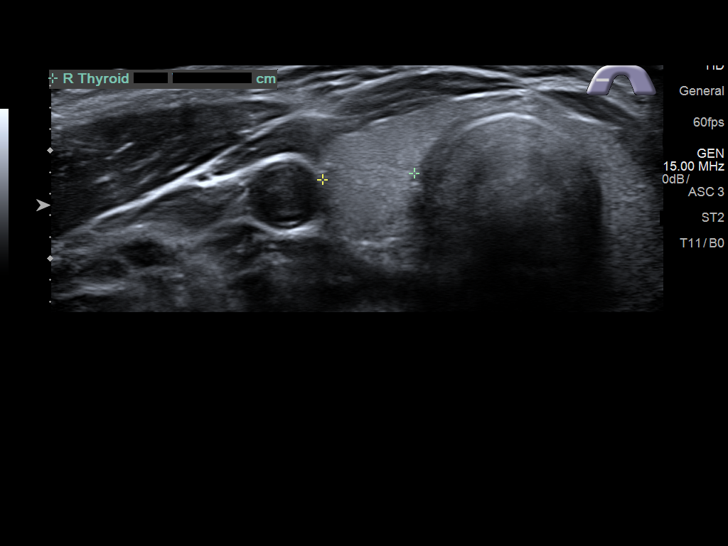
[im 13/38]
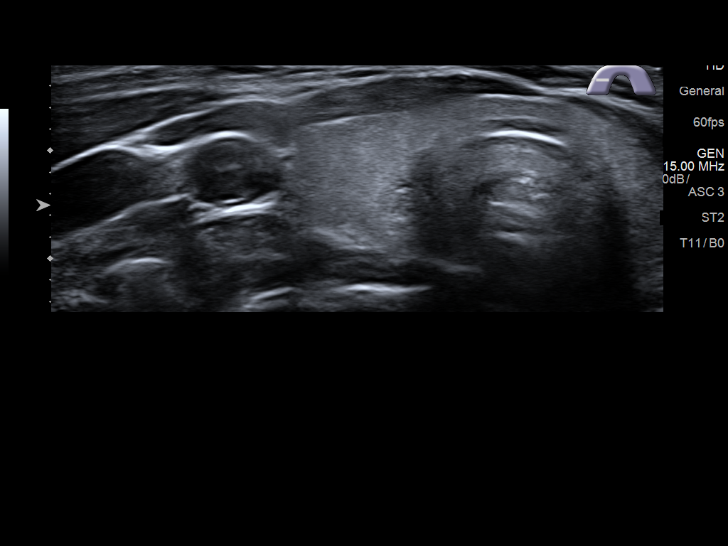
[im 14/38]
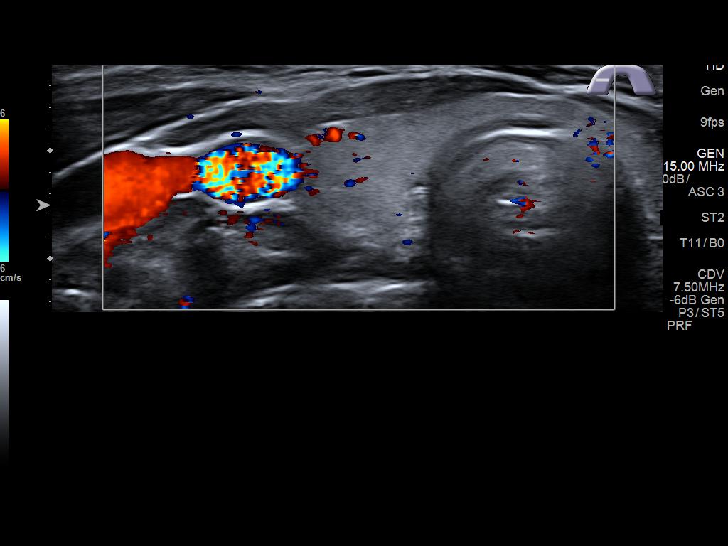
[im 17/38]
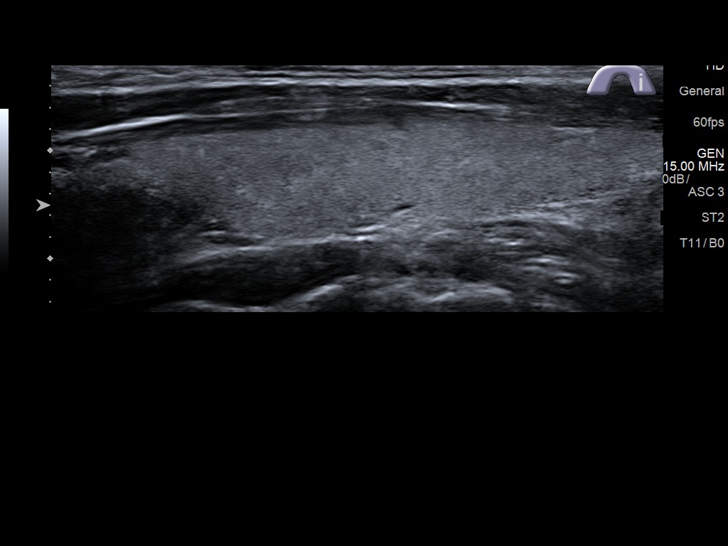
[im 21/38]
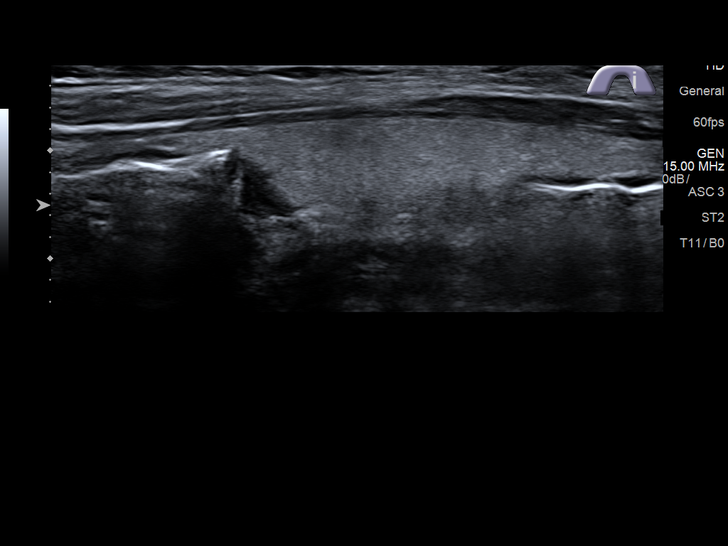
[im 24/38]
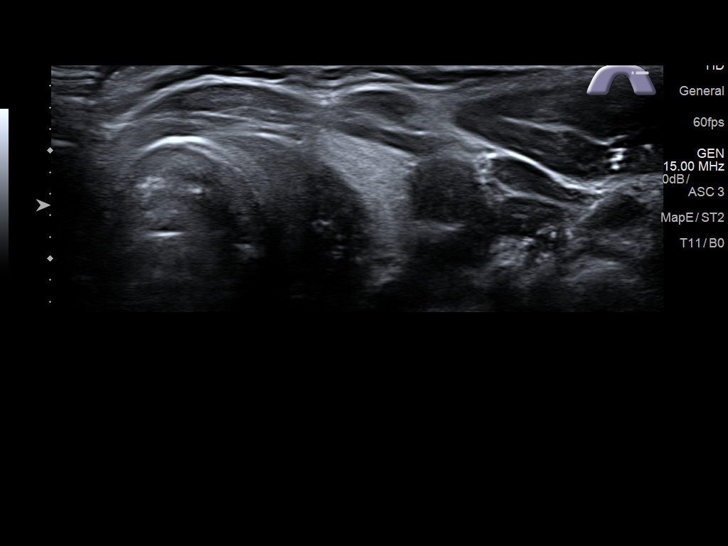
[im 25/38]
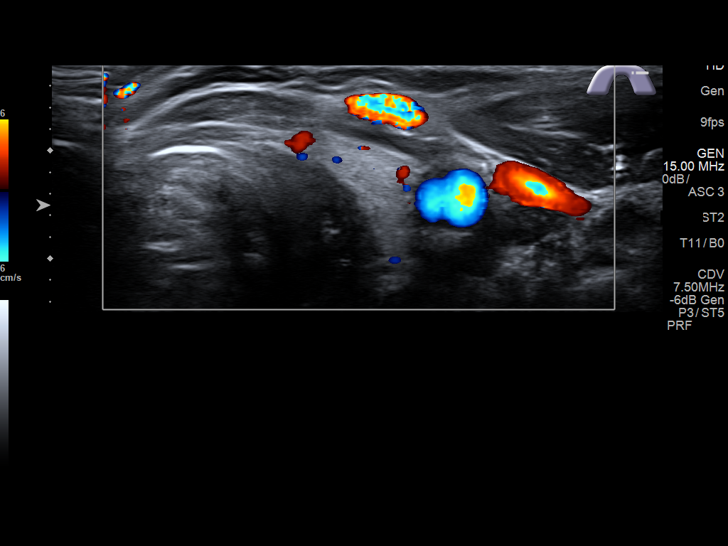
[im 28/38]
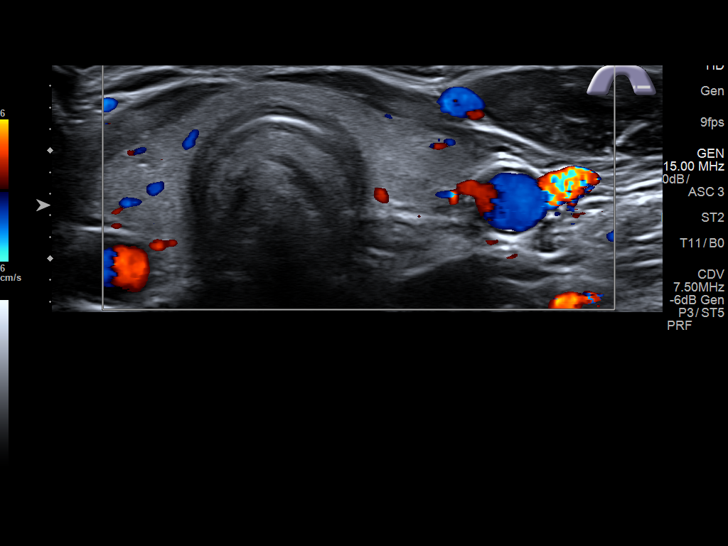
[im 31/38]
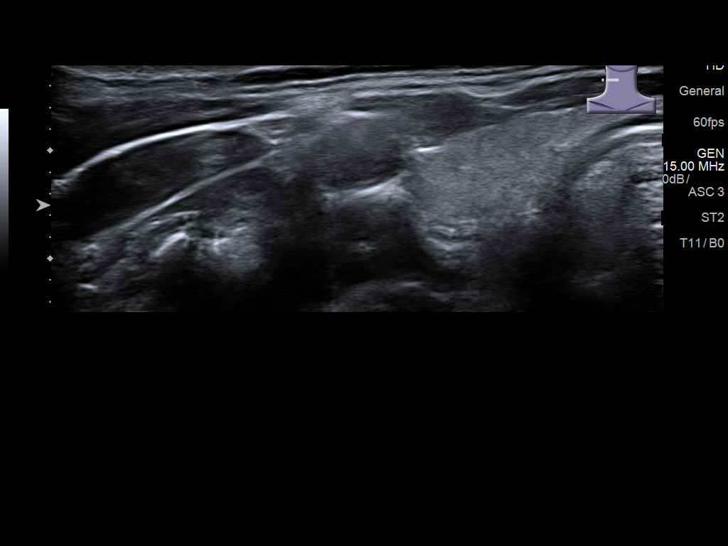
[im 34/38]
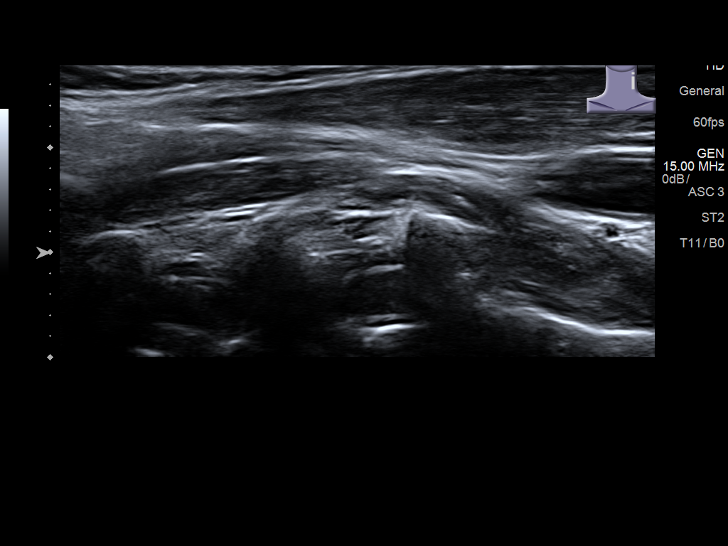
[im 38/38]
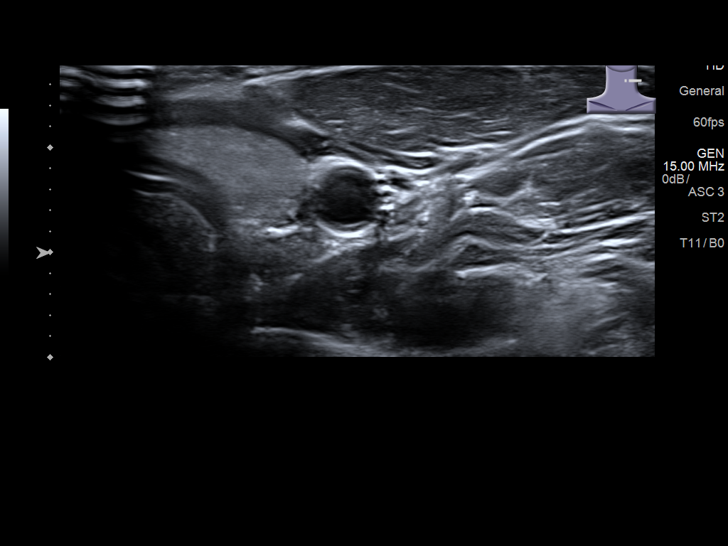

[14 of 25 positions shown; findings below may reference images not displayed]

FINDINGS: Parenchymal Echotexture: Normal

Isthmus: 2 mm

Right lobe: 5.0 x 1.0 x 1.0 cm

Left lobe: 4.9 x 1.0 x 1.0 cm

_________________________________________________________

Estimated total number of nodules >/= 1 cm: 0

Number of spongiform nodules >/=  2 cm not described below (TR1): 0

Number of mixed cystic and solid nodules >/= 1.5 cm not described
below (TR2): 0

_________________________________________________________

No discrete nodules are seen within the thyroid gland.
IMPRESSION: Normal thyroid ultrasound for age

The above is in keeping with the ACR TI-RADS recommendations - [HOSPITAL] 7434;[DATE].

## 2019-11-18 ENCOUNTER — Ambulatory Visit: Payer: Medicare Other | Attending: Internal Medicine

## 2019-11-18 DIAGNOSIS — Z20822 Contact with and (suspected) exposure to covid-19: Secondary | ICD-10-CM

## 2019-11-19 LAB — NOVEL CORONAVIRUS, NAA: SARS-CoV-2, NAA: DETECTED — AB

## 2019-11-19 LAB — SARS-COV-2, NAA 2 DAY TAT

## 2019-11-20 ENCOUNTER — Encounter: Payer: Self-pay | Admitting: Physician Assistant

## 2019-11-24 ENCOUNTER — Telehealth: Payer: Self-pay | Admitting: Physician Assistant

## 2019-11-24 DIAGNOSIS — N3000 Acute cystitis without hematuria: Secondary | ICD-10-CM

## 2019-11-24 NOTE — Telephone Encounter (Signed)
Pt called and stated that she is covid positive and from taking meds she has gotten a UTI . She is having burning and frequency. She would like to know if something could be call in for her today without an appt.  She stated she get uti often    Best number [place]

## 2019-11-25 MED ORDER — CIPROFLOXACIN HCL 250 MG PO TABS: 250.0000 mg | ORAL_TABLET | Freq: Two times a day (BID) | ORAL | 0 refills | Status: DC

## 2019-11-25 NOTE — Telephone Encounter (Signed)
Left Detail message that antibiotic was sent in for her.

## 2019-11-25 NOTE — Telephone Encounter (Signed)
Please Review

## 2019-11-25 NOTE — Telephone Encounter (Signed)
Cipro sent in for her

## 2019-12-21 DIAGNOSIS — Z960 Presence of urogenital implants: Secondary | ICD-10-CM | POA: Diagnosis not present

## 2019-12-21 DIAGNOSIS — Z452 Encounter for adjustment and management of vascular access device: Secondary | ICD-10-CM | POA: Diagnosis not present

## 2020-03-05 NOTE — Progress Notes (Signed)
Subjective:   Susan Howard is a 49 y.o. female who presents for an Initial Medicare Annual Wellness Visit.  I connected with [name] today by telephone and verified that I am speaking with the correct person using two identifiers. Location patient: home Location provider: work Persons participating in the virtual visit: patient, provider.   I discussed the limitations, risks, security and privacy concerns of performing an evaluation and management service by telephone and the availability of in person appointments. I also discussed with the patient that there may be a patient responsible charge related to this service. The patient expressed understanding and verbally consented to this telephonic visit.    Interactive audio and video telecommunications were attempted between this provider and patient, however failed, due to patient having technical difficulties OR patient did not have access to video capability.  We continued and completed visit with audio only.   Review of Systems    N/A  Cardiac Risk Factors include: none     Objective:    There were no vitals filed for this visit. There is no height or weight on file to calculate BMI.  Advanced Directives 03/06/2020 09/28/2019 06/01/2019 01/19/2019 11/08/2018  Does Patient Have a Medical Advance Directive? _0   Would patient like information on creating a medical advance directive? No - Patient declined - No - Patient declined No - Patient declined No - Patient declined    Current Medications (verified) Outpatient Encounter Medications as of 03/06/2020  Medication Sig  . Multiple Vitamin (MULTIVITAMIN) capsule Take 1 capsule by mouth daily.  . [DISCONTINUED] ciprofloxacin (CIPRO) 250 MG tablet Take 1 tablet (250 mg total) by mouth 2 (two) times daily.  . [DISCONTINUED] Iron-Vitamin C (VITRON-C) 65-125 MG TABS Take by mouth.   No facility-administered encounter medications on file as of 03/06/2020.     Allergies (verified) Latex, Other, Clindamycin/lincomycin, and Septra [sulfamethoxazole-trimethoprim]   History: Past Medical History:  Diagnosis Date  . Family history of breast cancer    declined BRCA testing 2013  . Iron deficiency anemia due to chronic blood loss 11/09/2018  . MRSA (methicillin resistant Staphylococcus aureus)   . Spina bifida ([place])   . Status post implantation of artificial urinary sphincter 2006   neurogenic bladder  . UTI (urinary tract infection) 12/2016   Past Surgical History:  Procedure Laterality Date  . BACK SURGERY     for spinal bifida  . CESAREAN SECTION  [phone]   G4P4  . 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 HIP SURGERY  2012   [facility]  Center-transfer of muscle from left buttock  . TUBAL LIGATION  1996  . URINARY SPHINCTER REVISION  10/18/2016   urinary sphincter prosthesis for urinary incontinence with multiple revisions. Last revision 09/2014   Family History  Problem Relation Age of Onset  . Breast cancer Mother 25  . Hypertension Mother   . Leukemia Father 62  . Neural tube defect Son        spinal bifida  . Breast cancer Maternal Grandmother 64  . Hypertension Maternal Grandmother   . Heart disease Maternal Grandfather   . Heart attack Maternal Grandfather 45   Social History   Socioeconomic History  . Marital status: Married    Spouse name: Not on file  . Number of children: 4  . Years of education: Bachelor's  . Highest education level: Not on file  Occupational History  . Occupation:  RN  Tobacco Use  . Smoking status: Never Smoker  . Smokeless tobacco: Never Used  Vaping Use  . Vaping Use: Never used  Substance and Sexual Activity  . Alcohol use: No    Alcohol/week: 0.0 standard drinks  . Drug use: No  . Sexual activity: Yes    Partners: Male    Birth control/protection: Surgical    Comment: tubal ligation  Other Topics Concern  . Not on file  Social History  Narrative  . Not on file   Social Determinants of Health   Financial Resource Strain: Low Risk   . Difficulty of Paying Living Expenses: Not hard at all  Food Insecurity: No Food Insecurity  . Worried About Charity fundraiser in the Last Year: Never true  . Ran Out of Food in the Last Year: Never true  Transportation Needs: No Transportation Needs  . Lack of Transportation (Medical): No  . Lack of Transportation (Non-Medical): No  Physical Activity: Sufficiently Active  . Days of Exercise per Week: 6 days  . Minutes of Exercise per Session: 60 min  Stress: No Stress Concern Present  . Feeling of Stress : Not at all  Social Connections: Moderately Integrated  . Frequency of Communication with Friends and Family: More than three times a week  . Frequency of Social Gatherings with Friends and Family: More than three times a week  . Attends Religious Services: More than 4 times per year  . Active Member of Clubs or Organizations: No  . Attends Archivist Meetings: Never  . Marital Status: Married    Tobacco Counseling Counseling given: Not Answered   Clinical Intake:  Pre-visit preparation completed: Yes  Pain : No/denies pain     Nutritional Risks: None Diabetes: No  How often do you need to have someone help you when you read instructions, pamphlets, or other written materials from your doctor or pharmacy?: 1 - Never  Diabetic? No  Interpreter Needed?: No  Information entered by :: [facility], LPN   Activities of Daily Living In your present state of health, do you have any difficulty performing the following activities: 03/06/2020  Hearing? N  Vision? N  Difficulty concentrating or making decisions? N  Walking or climbing stairs? N  Dressing or bathing? N  Doing errands, shopping? N  Preparing Food and eating ? N  Using the Toilet? N  In the past six months, have you accidently leaked urine? N  Do you have problems with loss of bowel control? N    Managing your Medications? N  Managing your Finances? N  Housekeeping or managing your Housekeeping? N  Some recent data might be hidden    Patient Care Team: [name] as PCP - General (Family Medicine) Ponciano Ort, MD as Referring Physician (Urology) Otilio Carpen, MD as Referring Physician (Obstetrics and Gynecology) System, Provider Not In (Dermatology) Earlie Server, MD as Consulting Physician (Oncology)  Indicate any recent Medical Services you may have received from other than [facility] providers in the past year (date may be approximate).     Assessment:   This is a routine wellness examination for [place].  Hearing/Vision screen No exam data present  Dietary issues and exercise activities discussed: Current Exercise Habits: Home exercise routine;Structured exercise class, Type of exercise: stretching;treadmill;Other - see comments (running and swimming), Time (Minutes): > 60, Frequency (Times/Week): 6, Weekly Exercise (Minutes/Week): 0, Intensity: Moderate, Exercise limited by: None identified  Goals    . DIET -  INCREASE WATER INTAKE     Recommend to drink at least 6-8 8oz glasses of water per day.      Depression Screen PHQ 2/9 Scores 03/06/2020 10/01/2018  PHQ - 2 Score 0 0    Fall Risk Fall Risk  03/06/2020  Falls in the past year? 0  Number falls in past yr: 0  Injury with Fall? 0    Any stairs in or around the home? Yes  If so, are there any without handrails? No  Home free of loose throw rugs in walkways, pet beds, electrical cords, etc? Yes  Adequate lighting in your home to reduce risk of falls? Yes   ASSISTIVE DEVICES UTILIZED TO PREVENT FALLS:  Life alert? No  Use of a cane, walker or w/c? No  Grab bars in the bathroom? No  Shower chair or bench in shower? No  Elevated toilet seat or a handicapped toilet? No    Cognitive Function: Declined today.        Immunizations Immunization History  Administered Date(s)  Administered  . Hepatitis B 09/22/2012, 10/28/2012  . Hepatitis B, adult 09/28/2014  . Influenza Inj Mdck Quad Pf 02/04/2017  . Influenza,inj,Quad PF,6+ Mos 01/27/2015, 01/15/2016, 01/20/2020  . Influenza-Unspecified 02/02/2018  . PFIZER SARS-COV-2 Vaccination 12/16/2019, 01/09/2020  . Tdap 04/11/2013, 11/04/2018    TDAP status: Up to date Flu Vaccine status: Up to date Covid-19 vaccine status: Completed vaccines  Qualifies for Shingles Vaccine? Yes   Zostavax completed No   Shingrix Completed?: No.    Education has been provided regarding the importance of this vaccine. Patient has been advised to call insurance company to determine out of pocket expense if they have not yet received this vaccine. Advised may also receive vaccine at local pharmacy or Health Dept. Verbalized acceptance and understanding.  Screening Tests Health Maintenance  Topic Date Due  . HIV Screening  Never done  . COLONOSCOPY  Never done  . MAMMOGRAM  03/09/2021  . PAP SMEAR-Modifier  01/27/2022  . TETANUS/TDAP  11/03/2028  . INFLUENZA VACCINE  Completed  . COVID-19 Vaccine  Completed    Health Maintenance  Health Maintenance Due  Topic Date Due  . HIV Screening  Never done  . COLONOSCOPY  Never done    Colorectal cancer screening: Currently due. Cologuard kit ordered today. Mammogram status: Completed 03/10/19. Repeat every year.   Lung Cancer Screening: (Low Dose CT Chest recommended if Age 105-80 years, 30 pack-year currently smoking OR have quit w/in 15years.) does not qualify.   Additional Screening:  Vision Screening: Recommended annual ophthalmology exams for early detection of glaucoma and other disorders of the eye. Is the patient up to date with their annual eye exam? No Who is the provider or what is the name of the office in which the patient attends annual eye exams? N/A If pt is not established with a provider, would they like to be referred to a provider to establish care? No .    Dental Screening: Recommended annual dental exams for proper oral hygiene  Community Resource Referral / Chronic Care Management: CRR required this visit?  No   CCM required this visit?  No      Plan:     I have personally reviewed and noted the following in the patient's chart:   . Medical and social history . Use of alcohol, tobacco or illicit drugs  . Current medications and supplements . Functional ability and status . Nutritional status . Physical activity . Advanced directives .  List of other physicians . Hospitalizations, surgeries, and ER visits in previous 12 months . Vitals . Screenings to include cognitive, depression, and falls . Referrals and appointments  In addition, I have reviewed and discussed with patient certain preventive protocols, quality metrics, and best practice recommendations. A written personalized care plan for preventive services as well as general preventive health recommendations were provided to patient.     [place], [place]   [id number]   Nurse Notes: None.

## 2020-03-06 ENCOUNTER — Other Ambulatory Visit: Payer: Self-pay

## 2020-03-06 ENCOUNTER — Ambulatory Visit (INDEPENDENT_AMBULATORY_CARE_PROVIDER_SITE_OTHER): Payer: Medicare Other

## 2020-03-06 DIAGNOSIS — Z0000 Encounter for general adult medical examination without abnormal findings: Secondary | ICD-10-CM

## 2020-03-06 DIAGNOSIS — Z1211 Encounter for screening for malignant neoplasm of colon: Secondary | ICD-10-CM

## 2020-03-06 NOTE — Patient Instructions (Signed)
Susan Howard , Thank you for taking time to come for your Medicare Wellness Visit. I appreciate your ongoing commitment to your health goals. Please review the following plan we discussed and let me know if I can assist you in the future.   Screening recommendations/referrals: Colonoscopy: Currently due. Cologuard order today.  Mammogram: Up to date, due 03/09/20. Recommended yearly ophthalmology/optometry visit for glaucoma screening and checkup Recommended yearly dental visit for hygiene and checkup  Vaccinations: Influenza vaccine: Done 01/2020 Tdap vaccine: Up to date, due 10/2028 Shingles vaccine: Shingrix discussed. Please contact your pharmacy for coverage information.     Advanced directives: Advance directive discussed with you today. Even though you declined this today please call our office should you change your mind and we can give you the proper paperwork for you to fill out.  Conditions/risks identified: Recommend to drink at least 6-8 8oz glasses of water per day.  Next appointment: 03/20/20 @ 9:00 AM for an AWV. Declined scheduling a follow up with PCP at this time. Will call back to schedule apt.   Preventive Care 40-64 Years, Female Preventive care refers to lifestyle choices and visits with your health care provider that can promote health and wellness. What does preventive care include?  A yearly physical exam. This is also called an annual well check.  Dental exams once or twice a year.  Routine eye exams. Ask your health care provider how often you should have your eyes checked.  Personal lifestyle choices, including:  Daily care of your teeth and gums.  Regular physical activity.  Eating a healthy diet.  Avoiding tobacco and drug use.  Limiting alcohol use.  Practicing safe sex.  Taking low-dose aspirin daily starting at age 73.  Taking vitamin and mineral supplements as recommended by your health care provider. What happens during an annual well  check? The services and screenings done by your health care provider during your annual well check will depend on your age, overall health, lifestyle risk factors, and family history of disease. Counseling  Your health care provider may ask you questions about your:  Alcohol use.  Tobacco use.  Drug use.  Emotional well-being.  Home and relationship well-being.  Sexual activity.  Eating habits.  Work and work Statistician.  Method of birth control.  Menstrual cycle.  Pregnancy history. Screening  You may have the following tests or measurements:  Height, weight, and BMI.  Blood pressure.  Lipid and cholesterol levels. These may be checked every 5 years, or more frequently if you are over 42 years old.  Skin check.  Lung cancer screening. You may have this screening every year starting at age 54 if you have a 30-pack-year history of smoking and currently smoke or have quit within the past 15 years.  Fecal occult blood test (FOBT) of the stool. You may have this test every year starting at age 6.  Flexible sigmoidoscopy or colonoscopy. You may have a sigmoidoscopy every 5 years or a colonoscopy every 10 years starting at age 70.  Hepatitis C blood test.  Hepatitis B blood test.  Sexually transmitted disease (STD) testing.  Diabetes screening. This is done by checking your blood sugar (glucose) after you have not eaten for a while (fasting). You may have this done every 1-3 years.  Mammogram. This may be done every 1-2 years. Talk to your health care provider about when you should start having regular mammograms. This may depend on whether you have a family history of breast cancer.  BRCA-related cancer screening. This may be done if you have a family history of breast, ovarian, tubal, or peritoneal cancers.  Pelvic exam and Pap test. This may be done every 3 years starting at age 57. Starting at age 67, this may be done every 5 years if you have a Pap test in  combination with an HPV test.  Bone density scan. This is done to screen for osteoporosis. You may have this scan if you are at high risk for osteoporosis. Discuss your test results, treatment options, and if necessary, the need for more tests with your health care provider. Vaccines  Your health care provider may recommend certain vaccines, such as:  Influenza vaccine. This is recommended every year.  Tetanus, diphtheria, and acellular pertussis (Tdap, Td) vaccine. You may need a Td booster every 10 years.  Zoster vaccine. You may need this after age 79.  Pneumococcal 13-valent conjugate (PCV13) vaccine. You may need this if you have certain conditions and were not previously vaccinated.  Pneumococcal polysaccharide (PPSV23) vaccine. You may need one or two doses if you smoke cigarettes or if you have certain conditions. Talk to your health care provider about which screenings and vaccines you need and how often you need them. This information is not intended to replace advice given to you by your health care provider. Make sure you discuss any questions you have with your health care provider. Document Released: 05/04/2015 Document Revised: 12/26/2015 Document Reviewed: 02/06/2015 Elsevier Interactive Patient Education  2017 [place] Prevention in the Home Falls can cause injuries. They can happen to people of all ages. There are many things you can do to make your home safe and to help prevent falls. What can I do on the outside of my home?  Regularly fix the edges of walkways and driveways and fix any cracks.  Remove anything that might make you trip as you walk through a door, such as a raised step or threshold.  Trim any bushes or trees on the path to your home.  Use bright outdoor lighting.  Clear any walking paths of anything that might make someone trip, such as rocks or tools.  Regularly check to see if handrails are loose or broken. Make sure that both  sides of any steps have handrails.  Any raised decks and porches should have guardrails on the edges.  Have any leaves, snow, or ice cleared regularly.  Use sand or salt on walking paths during winter.  Clean up any spills in your garage right away. This includes oil or grease spills. What can I do in the bathroom?  Use night lights.  Install grab bars by the toilet and in the tub and shower. Do not use towel bars as grab bars.  Use non-skid mats or decals in the tub or shower.  If you need to sit down in the shower, use a plastic, non-slip stool.  Keep the floor dry. Clean up any water that spills on the floor as soon as it happens.  Remove soap buildup in the tub or shower regularly.  Attach bath mats securely with double-sided non-slip rug tape.  Do not have throw rugs and other things on the floor that can make you trip. What can I do in the bedroom?  Use night lights.  Make sure that you have a light by your bed that is easy to reach.  Do not use any sheets or blankets that are too big for your bed. They  should not hang down onto the floor.  Have a firm chair that has side arms. You can use this for support while you get dressed.  Do not have throw rugs and other things on the floor that can make you trip. What can I do in the kitchen?  Clean up any spills right away.  Avoid walking on wet floors.  Keep items that you use a lot in easy-to-reach places.  If you need to reach something above you, use a strong step stool that has a grab bar.  Keep electrical cords out of the way.  Do not use floor polish or wax that makes floors slippery. If you must use wax, use non-skid floor wax.  Do not have throw rugs and other things on the floor that can make you trip. What can I do with my stairs?  Do not leave any items on the stairs.  Make sure that there are handrails on both sides of the stairs and use them. Fix handrails that are broken or loose. Make sure that  handrails are as long as the stairways.  Check any carpeting to make sure that it is firmly attached to the stairs. Fix any carpet that is loose or worn.  Avoid having throw rugs at the top or bottom of the stairs. If you do have throw rugs, attach them to the floor with carpet tape.  Make sure that you have a light switch at the top of the stairs and the bottom of the stairs. If you do not have them, ask someone to add them for you. What else can I do to help prevent falls?  Wear shoes that:  Do not have high heels.  Have rubber bottoms.  Are comfortable and fit you well.  Are closed at the toe. Do not wear sandals.  If you use a stepladder:  Make sure that it is fully opened. Do not climb a closed stepladder.  Make sure that both sides of the stepladder are locked into place.  Ask someone to hold it for you, if possible.  Clearly mark and make sure that you can see:  Any grab bars or handrails.  First and last steps.  Where the edge of each step is.  Use tools that help you move around (mobility aids) if they are needed. These include:  Canes.  Walkers.  Scooters.  Crutches.  Turn on the lights when you go into a dark area. Replace any light bulbs as soon as they burn out.  Set up your furniture so you have a clear path. Avoid moving your furniture around.  If any of your floors are uneven, fix them.  If there are any pets around you, be aware of where they are.  Review your medicines with your doctor. Some medicines can make you feel dizzy. This can increase your chance of falling. Ask your doctor what other things that you can do to help prevent falls. This information is not intended to replace advice given to you by your health care provider. Make sure you discuss any questions you have with your health care provider. Document Released: 02/01/2009 Document Revised: 09/13/2015 Document Reviewed: 05/12/2014 Elsevier Interactive Patient Education  2017  [employer].

## 2020-03-27 IMAGING — MG DIGITAL SCREENING BILAT W/ TOMO W/ CAD
8 series · 9 of 24 positions shown · non-contrast
Comparison: Previous exam(s).

CLINICAL DATA: Screening.

EXAM:
DIGITAL SCREENING BILATERAL MAMMOGRAM WITH TOMO AND CAD

[R CC synth-2D]
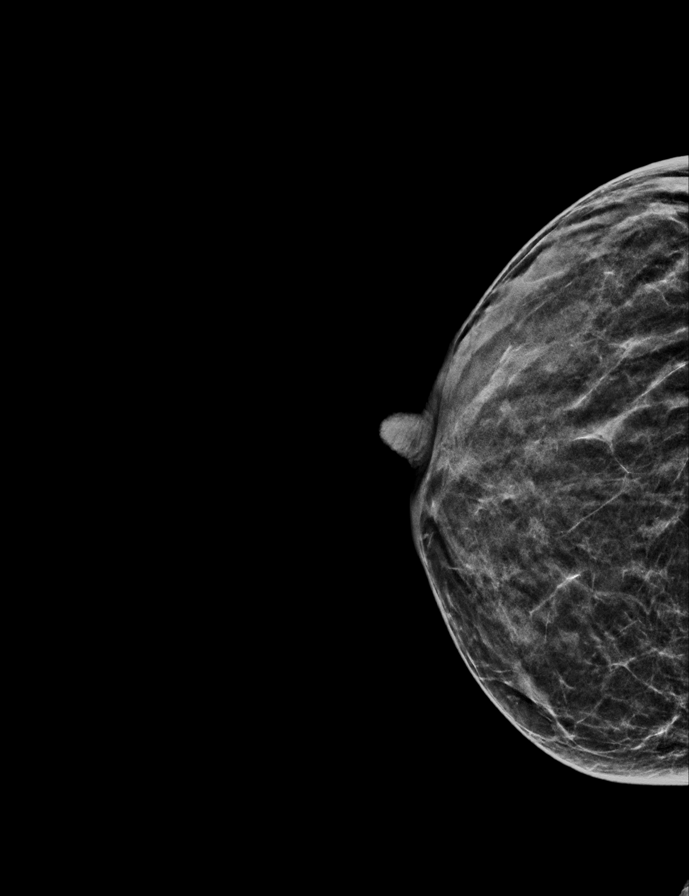

[L MLO synth-2D]
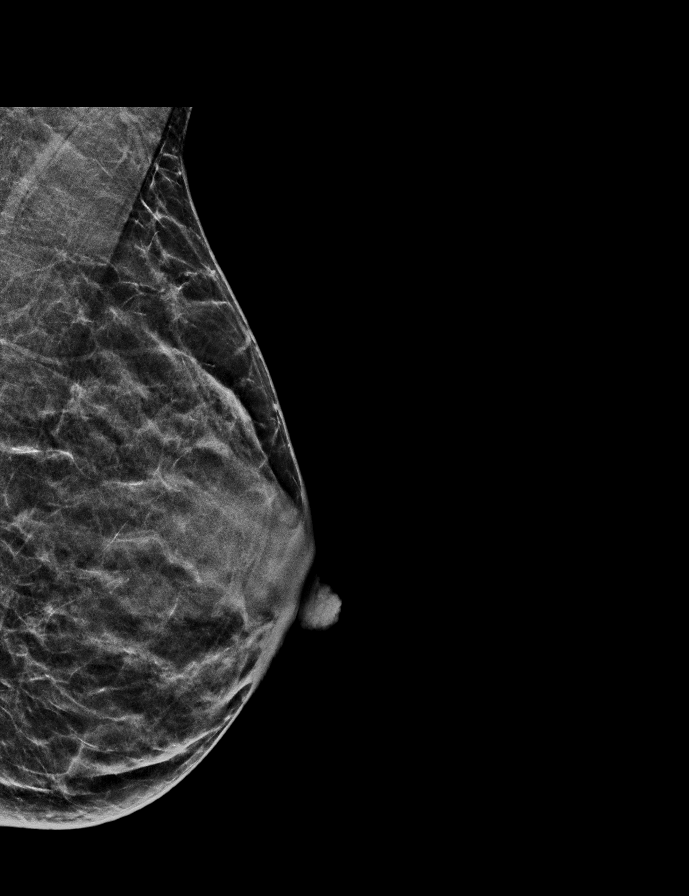

[L CC synth-2D]
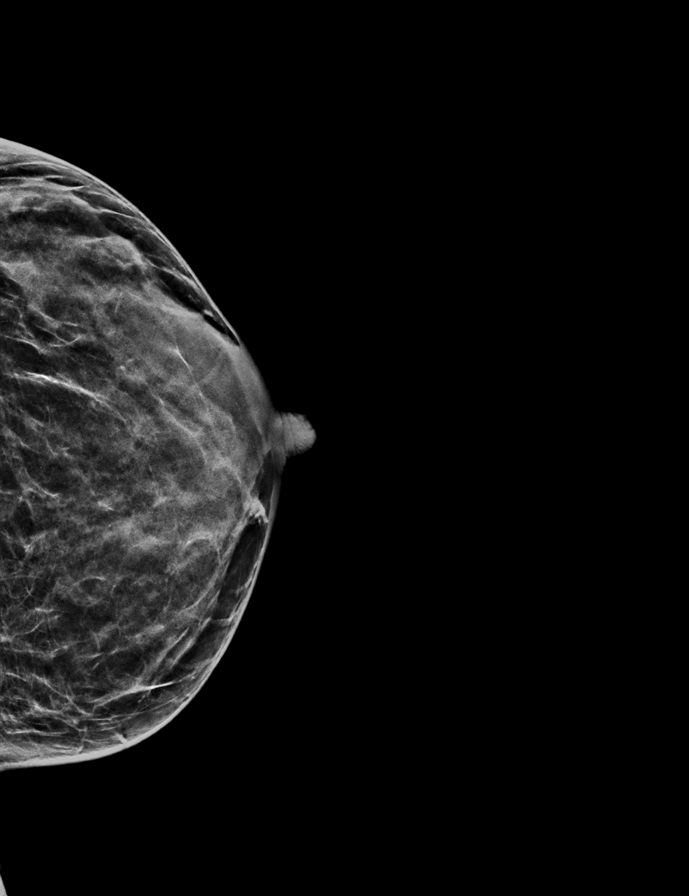

[R MLO synth-2D]
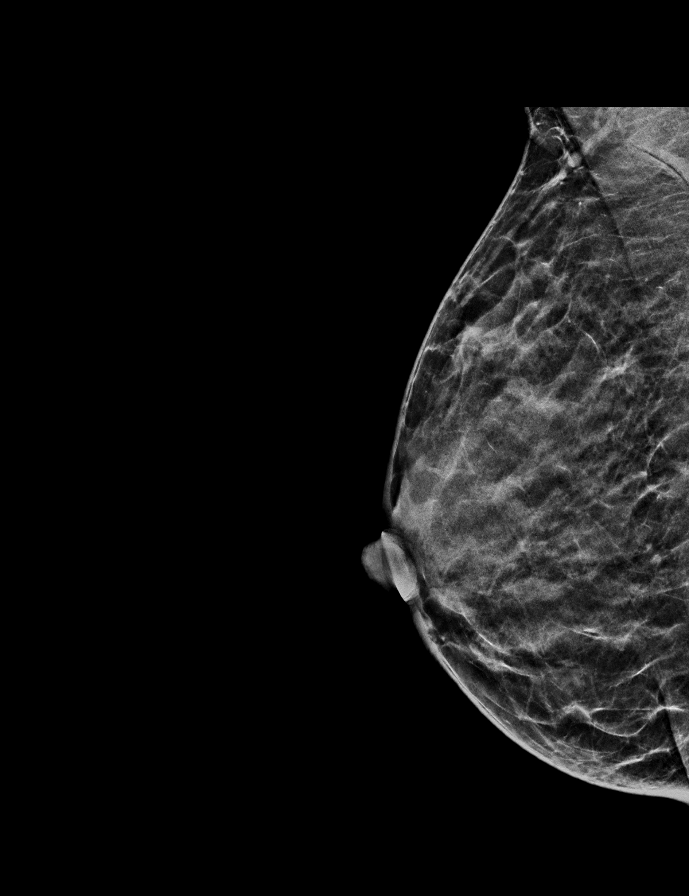

[R MLO tomo · 2 of 37 frames shown]
[frame 13/37]
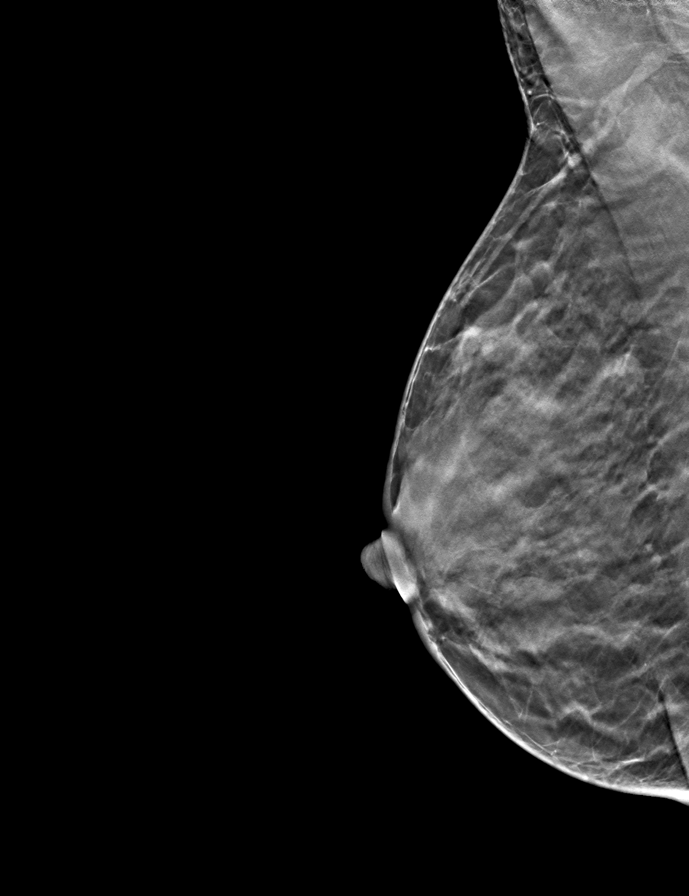
[frame 19/37]
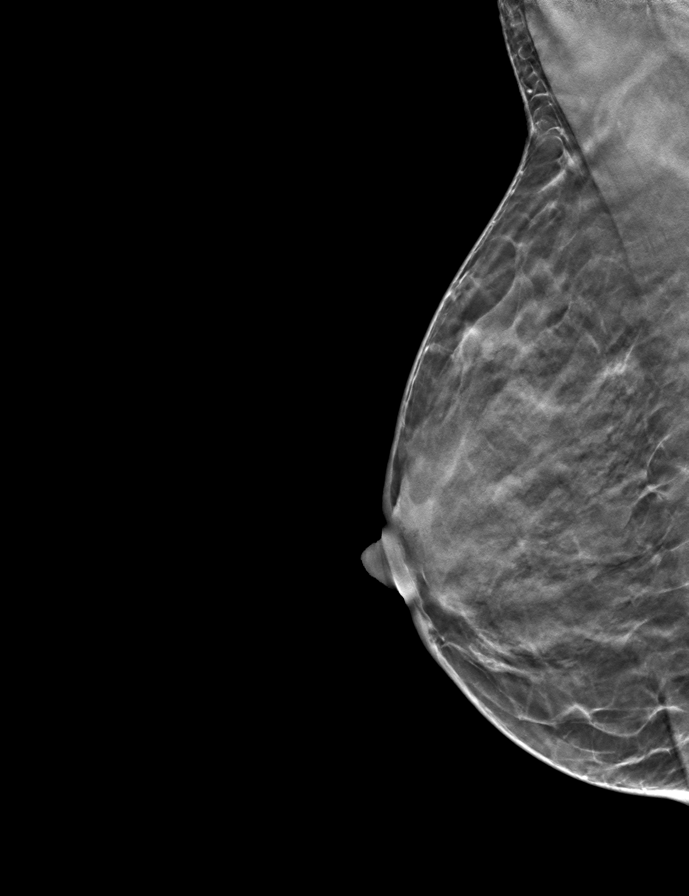

[R CC tomo · tomo slice 19/37.0]
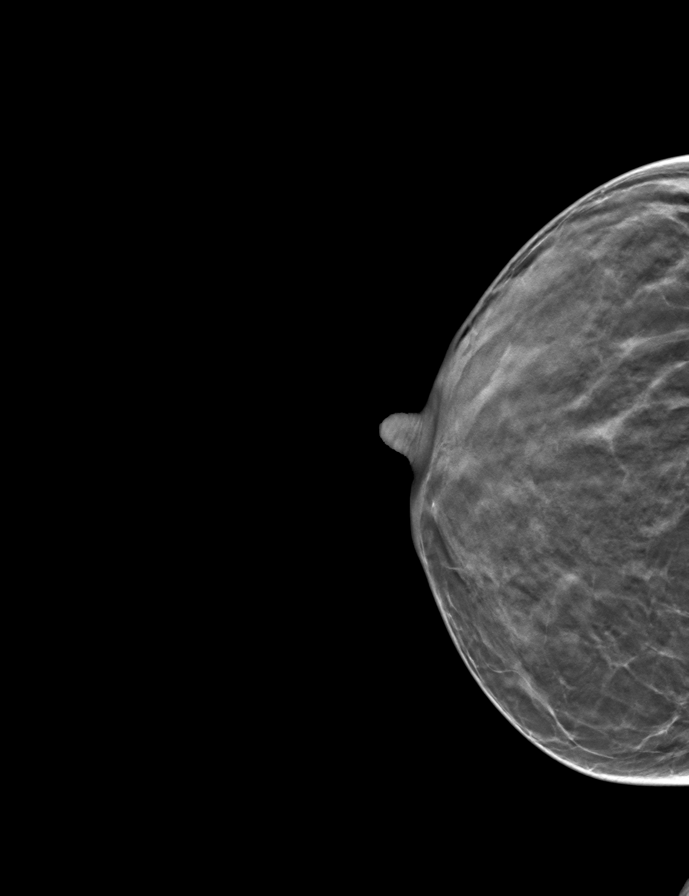

[L CC tomo · tomo slice 21/40.0]
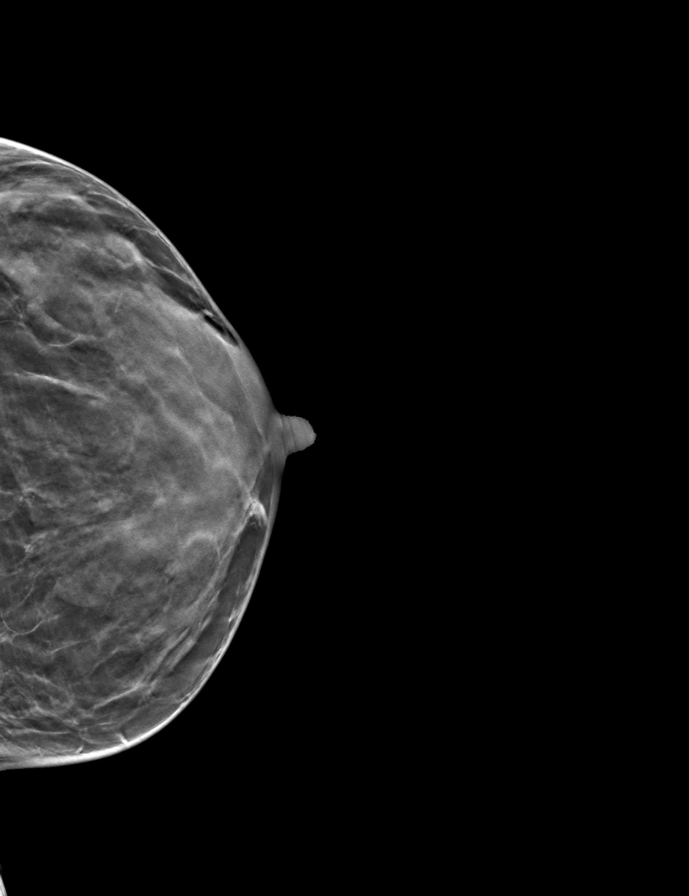

[L MLO tomo · tomo slice 20/39.0]
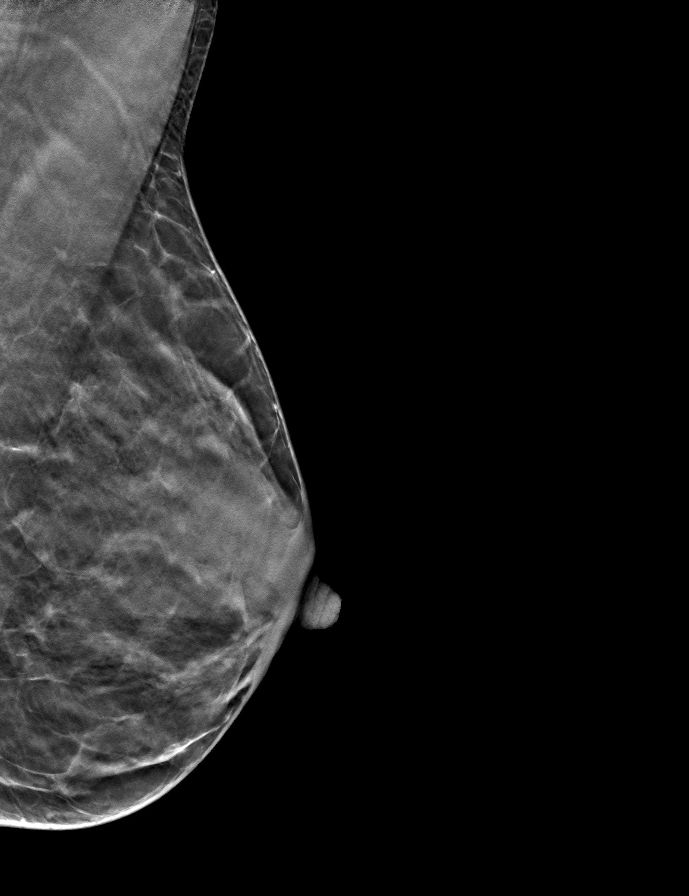

[9 of 24 positions shown; findings below may reference images not displayed]

ACR Breast Density Category c: The breast tissue is heterogeneously
dense, which may obscure small masses.
FINDINGS: There are no findings suspicious for malignancy. Images were
processed with CAD.
IMPRESSION: No mammographic evidence of malignancy. A result letter of this
screening mammogram will be mailed directly to the patient.

RECOMMENDATION:
Screening mammogram in one year. (Code:[id number]-[id number])

BI-RADS CATEGORY  1: Negative.

## 2020-03-28 ENCOUNTER — Inpatient Hospital Stay: Payer: Medicare Other | Attending: Oncology

## 2020-03-28 DIAGNOSIS — D500 Iron deficiency anemia secondary to blood loss (chronic): Secondary | ICD-10-CM | POA: Diagnosis not present

## 2020-03-28 LAB — CBC WITH DIFFERENTIAL/PLATELET
Abs Immature Granulocytes: 0.01 10*3/uL (ref 0.00–0.07)
Basophils Absolute: 0.1 10*3/uL (ref 0.0–0.1)
Basophils Relative: 1 %
Eosinophils Absolute: 0.1 10*3/uL (ref 0.0–0.5)
Eosinophils Relative: 2 %
HCT: 40.2 % (ref 36.0–46.0)
Hemoglobin: 13.6 g/dL (ref 12.0–15.0)
Immature Granulocytes: 0 %
Lymphocytes Relative: 32 %
Lymphs Abs: 1.5 10*3/uL (ref 0.7–4.0)
MCH: 30.6 pg (ref 26.0–34.0)
MCHC: 33.8 g/dL (ref 30.0–36.0)
MCV: 90.5 fL (ref 80.0–100.0)
Monocytes Absolute: 0.5 10*3/uL (ref 0.1–1.0)
Monocytes Relative: 10 %
Neutro Abs: 2.6 10*3/uL (ref 1.7–7.7)
Neutrophils Relative %: 55 %
Platelets: 256 10*3/uL (ref 150–400)
RBC: 4.44 MIL/uL (ref 3.87–5.11)
RDW: 13 % (ref 11.5–15.5)
WBC: 4.8 10*3/uL (ref 4.0–10.5)
nRBC: 0 % (ref 0.0–0.2)

## 2020-03-28 LAB — IRON AND TIBC
Iron: 36 ug/dL (ref 28–170)
Saturation Ratios: 11 % (ref 10.4–31.8)
TIBC: 330 ug/dL (ref 250–450)
UIBC: 294 ug/dL

## 2020-03-28 LAB — FERRITIN: Ferritin: 8 ng/mL — ABNORMAL LOW (ref 11–307)

## 2020-03-29 ENCOUNTER — Ambulatory Visit (INDEPENDENT_AMBULATORY_CARE_PROVIDER_SITE_OTHER): Payer: Medicare Other | Admitting: Adult Health

## 2020-03-29 ENCOUNTER — Encounter: Payer: Self-pay | Admitting: Adult Health

## 2020-03-29 ENCOUNTER — Other Ambulatory Visit: Payer: Self-pay

## 2020-03-29 VITALS — BP 161/99 | HR 66 | Temp 97.8°F | Resp 16 | Wt 109.8 lb

## 2020-03-29 DIAGNOSIS — R300 Dysuria: Secondary | ICD-10-CM

## 2020-03-29 DIAGNOSIS — N3001 Acute cystitis with hematuria: Secondary | ICD-10-CM | POA: Diagnosis not present

## 2020-03-29 MED ORDER — NITROFURANTOIN MONOHYD MACRO 100 MG PO CAPS
100.0000 mg | ORAL_CAPSULE | Freq: Two times a day (BID) | ORAL | 0 refills | Status: DC
Start: 2020-03-29 — End: 2020-07-18

## 2020-03-29 NOTE — Progress Notes (Signed)
Established patient visit   Patient: Susan Howard   DOB: 1970-07-26   49 y.o. Female  MRN: [id number] Visit Date: 03/29/2020  Today's healthcare provider: Marcille Buffy, FNP   Chief Complaint  Patient presents with  . Urinary Frequency   Subjective    Urinary Frequency  This is a new problem. The current episode started 1 to 4 weeks ago. The problem occurs every urination. The problem has been unchanged. The quality of the pain is described as burning. There has been no fever. Associated symptoms include frequency and urgency. Pertinent negatives include no chills, discharge, flank pain, hematuria, hesitancy, nausea, possible pregnancy, sweats or vomiting. She has tried nothing for the symptoms. Her past medical history is significant for recurrent UTIs.     Started 2 weeks a ago. Has artificial urinary sphincter.   Spina bifida.   Patient  denies any fever, body aches,chills, rash, chest pain, shortness of breath, nausea, vomiting, or diarrhea.  Denies dizziness, lightheadedness, pre syncopal or syncopal episodes.   Denies any concerns for STD's.    Allergies  Allergen Reactions  . Latex Other (See Comments) and Rash    Respiratory distress: patient allergic to ALL LATEX  . Other Itching and Other (See Comments)    Allergen - (blue) chux pads Reaction - sneezing Allergen - (blue) chux pads Reaction - sneezing Allergen - chlorine Reaction - sneezing   . Clindamycin/Lincomycin Rash  . Septra [Sulfamethoxazole-Trimethoprim] Rash    Patient Active Problem List   Diagnosis Date Noted  . Dysuria 03/29/2020  . Acute cystitis with hematuria 09/22/2019  . Menometrorrhagia 01/30/2019  . Iron deficiency anemia due to chronic blood loss 11/09/2018  . Bursitis of shoulder 11/08/2018  . Family history of breast cancer 01/20/2017  . History of methicillin resistant Staphylococcus aureus infection 12/06/2014  . Spina bifida of lumbar region [facility]) 10/11/2014    Past Medical History:  Diagnosis Date  . Family history of breast cancer    declined BRCA testing 2013  . Iron deficiency anemia due to chronic blood loss 11/09/2018  . MRSA (methicillin resistant Staphylococcus aureus)   . Spina bifida ([place])   . Status post implantation of artificial urinary sphincter 2006   neurogenic bladder  . UTI (urinary tract infection) 12/2016   Allergies  Allergen Reactions  . Latex Other (See Comments) and Rash    Respiratory distress: patient allergic to ALL LATEX  . Other Itching and Other (See Comments)    Allergen - (blue) chux pads Reaction - sneezing Allergen - (blue) chux pads Reaction - sneezing Allergen - chlorine Reaction - sneezing   . Clindamycin/Lincomycin Rash  . Septra [Sulfamethoxazole-Trimethoprim] Rash       Medications: Outpatient Medications Prior to Visit  Medication Sig  . Multiple Vitamin (MULTIVITAMIN) capsule Take 1 capsule by mouth daily.   No facility-administered medications prior to visit.    Review of Systems  Constitutional: Negative for chills.  Gastrointestinal: Negative for nausea and vomiting.  Genitourinary: Positive for frequency and urgency. Negative for flank pain, hematuria and hesitancy.    Last CBC Lab Results  Component Value Date   WBC 4.8 03/28/2020   HGB 13.6 03/28/2020   HCT 40.2 03/28/2020   MCV 90.5 03/28/2020   MCH 30.6 03/28/2020   RDW 13.0 03/28/2020   PLT 256 41/28/7867   Last metabolic panel Lab Results  Component Value Date   GLUCOSE 89 11/04/2018   NA 140 11/04/2018   K 4.4 11/04/2018  CL 105 11/04/2018   CO2 21 11/04/2018   BUN 11 11/04/2018   CREATININE 0.82 11/04/2018   GFRNONAA 85 11/04/2018   GFRAA 98 11/04/2018   CALCIUM 9.2 11/04/2018   PROT 6.6 11/04/2018   ALBUMIN 4.3 11/04/2018   LABGLOB 2.3 11/04/2018   AGRATIO 1.9 11/04/2018   BILITOT 0.2 11/04/2018   ALKPHOS 45 11/04/2018   AST 18 11/04/2018   ALT 11 11/04/2018      Objective    BP (!)  161/99   Pulse 66   Temp 97.8 F (36.6 C) (Oral)   Resp 16   Wt 109 lb 12.8 oz (49.8 kg)   BMI 20.75 kg/m  BP Readings from Last 3 Encounters:  03/29/20 (!) 161/99  09/22/19 (!) 151/94  06/09/19 128/90   Wt Readings from Last 3 Encounters:  03/29/20 109 lb 12.8 oz (49.8 kg)  09/22/19 108 lb (49 kg)  06/01/19 106 lb 12.8 oz (48.4 kg)      Physical Exam Vitals reviewed.  Constitutional:      General: She is not in acute distress.    Appearance: Normal appearance. She is not ill-appearing, toxic-appearing or diaphoretic.  HENT:     Head: Normocephalic and atraumatic.     Right Ear: There is no impacted cerumen.     Left Ear: There is no impacted cerumen.     Mouth/Throat:     Pharynx: No oropharyngeal exudate or posterior oropharyngeal erythema.  Eyes:     General: No scleral icterus.       Right eye: No discharge.        Left eye: No discharge.  Cardiovascular:     Rate and Rhythm: Normal rate and regular rhythm.     Heart sounds: Normal heart sounds.  Pulmonary:     Effort: Pulmonary effort is normal.     Breath sounds: Normal breath sounds.  Abdominal:     General: There is no distension.     Palpations: Abdomen is soft.     Tenderness: There is no abdominal tenderness.  Musculoskeletal:        General: Normal range of motion.     Cervical back: Normal range of motion and neck supple.  Skin:    Findings: No erythema or rash.  Neurological:     Mental Status: She is oriented to person, place, and time.  Psychiatric:        Mood and Affect: Mood normal.        Behavior: Behavior normal.        Thought Content: Thought content normal.        Judgment: Judgment normal.       Results for orders placed or performed in visit on 03/29/20  POCT urinalysis dipstick  Result Value Ref Range   Color, UA yellow    Clarity, UA clear    Glucose, UA Negative Negative   Bilirubin, UA negative    Ketones, UA negative    Spec Grav, UA 1.020 1.010 - 1.025   Blood, UA  negative    pH, UA 5.0 5.0 - 8.0   Protein, UA Negative Negative   Urobilinogen, UA 0.2 0.2 or 1.0 E.U./dL   Nitrite, UA positive    Leukocytes, UA Negative Negative   Appearance     Odor      Assessment & Plan     Acute cystitis with hematuria  Dysuria - Plan: POCT urinalysis dipstick, Urine Culture   Meds ordered this encounter  Medications  .  nitrofurantoin, macrocrystal-monohydrate, (MACROBID) 100 MG capsule    Sig: Take 1 capsule (100 mg total) by mouth 2 (two) times daily.    Dispense:  14 capsule    Refill:  0   Orders Placed This Encounter  Procedures  . Urine Culture  . POCT urinalysis dipstick   She prefers Macrobid over Keflex, will send for culture call if alternative needed.  increase fluids.   Red Flags discussed. The patient was given clear instructions to go to ER or return to medical center if any red flags develop, symptoms do not improve, worsen or new problems develop. They verbalized understanding.    Return in about 1 week (around 04/05/2020), or if symptoms worsen or fail to improve, for at any time for any worsening symptoms, Go to Emergency room/ urgent care if worse.      Marcille Buffy, [place] [phone] (phone) [phone] (fax)  [place]

## 2020-03-29 NOTE — Patient Instructions (Addendum)
Urinary Tract Infection, Adult A urinary tract infection (UTI) is an infection of any part of the urinary tract. The urinary tract includes:  The kidneys.  The ureters.  The bladder.  The urethra. These organs make, store, and get rid of pee (urine) in the body. What are the causes? This is caused by germs (bacteria) in your genital area. These germs grow and cause swelling (inflammation) of your urinary tract. What increases the risk? You are more likely to develop this condition if:  You have a small, thin tube (catheter) to drain pee.  You cannot control when you pee or poop (incontinence).  You are female, and: ? You use these methods to prevent pregnancy:  A medicine that kills sperm (spermicide).  A device that blocks sperm (diaphragm). ? You have low levels of a female hormone (estrogen). ? You are pregnant.  You have genes that add to your risk.  You are sexually active.  You take antibiotic medicines.  You have trouble peeing because of: ? A prostate that is bigger than normal, if you are female. ? A blockage in the part of your body that drains pee from the bladder (urethra). ? A kidney stone. ? A nerve condition that affects your bladder (neurogenic bladder). ? Not getting enough to drink. ? Not peeing often enough.  You have other conditions, such as: ? Diabetes. ? A weak disease-fighting system (immune system). ? Sickle cell disease. ? Gout. ? Injury of the spine. What are the signs or symptoms? Symptoms of this condition include:  Needing to pee right away (urgently).  Peeing often.  Peeing small amounts often.  Pain or burning when peeing.  Blood in the pee.  Pee that smells bad or not like normal.  Trouble peeing.  Pee that is cloudy.  Fluid coming from the vagina, if you are female.  Pain in the belly or lower back. Other symptoms include:  Throwing up (vomiting).  No urge to eat.  Feeling mixed up (confused).  Being tired  and grouchy (irritable).  A fever.  Watery poop (diarrhea). How is this treated? This condition may be treated with:  Antibiotic medicine.  Other medicines.  Drinking enough water. Follow these instructions at home:  Medicines  Take over-the-counter and prescription medicines only as told by your doctor.  If you were prescribed an antibiotic medicine, take it as told by your doctor. Do not stop taking it even if you start to feel better. General instructions  Make sure you: ? Pee until your bladder is empty. ? Do not hold pee for a long time. ? Empty your bladder after sex. ? Wipe from front to back after pooping if you are a female. Use each tissue one time when you wipe.  Drink enough fluid to keep your pee pale yellow.  Keep all follow-up visits as told by your doctor. This is important. Contact a doctor if:  You do not get better after 1-2 days.  Your symptoms go away and then come back. Get help right away if:  You have very bad back pain.  You have very bad pain in your lower belly.  You have a fever.  You are sick to your stomach (nauseous).  You are throwing up. Summary  A urinary tract infection (UTI) is an infection of any part of the urinary tract.  This condition is caused by germs in your genital area.  There are many risk factors for a UTI. These include having a small, thin   tube to drain pee and not being able to control when you pee or poop.  Treatment includes antibiotic medicines for germs.  Drink enough fluid to keep your pee pale yellow. This information is not intended to replace advice given to you by your health care provider. Make sure you discuss any questions you have with your health care provider. Document Revised: 03/25/2018 Document Reviewed: 10/15/2017 Elsevier Patient Education  [place]. Nitrofurantoin tablets or capsules What is this medicine? NITROFURANTOIN (nye troe fyoor AN toyn) is an antibiotic. It is used  to treat urinary tract infections. This medicine may be used for other purposes; ask your health care provider or pharmacist if you have questions. COMMON BRAND NAME(S): Macrobid, Macrodantin, Urotoin What should I tell my health care provider before I take this medicine? They need to know if you have any of these conditions:  anemia  diabetes  glucose-6-phosphate dehydrogenase deficiency  kidney disease  liver disease  lung disease  other chronic illness  an unusual or allergic reaction to nitrofurantoin, other antibiotics, other medicines, foods, dyes or preservatives  pregnant or trying to get pregnant  breast-feeding How should I use this medicine? Take this medicine by mouth with a glass of water. Follow the directions on the prescription label. Take this medicine with food or milk. Take your doses at regular intervals. Do not take your medicine more often than directed. Do not stop taking except on your doctor's advice. Talk to your pediatrician regarding the use of this medicine in children. While this drug may be prescribed for selected conditions, precautions do apply. Overdosage: If you think you have taken too much of this medicine contact a poison control center or emergency room at once. NOTE: This medicine is only for you. Do not share this medicine with others. What if I miss a dose? If you miss a dose, take it as soon as you can. If it is almost time for your next dose, take only that dose. Do not take double or extra doses. What may interact with this medicine?  antacids containing magnesium trisilicate  probenecid  quinolone antibiotics like ciprofloxacin, lomefloxacin, norfloxacin and ofloxacin  sulfinpyrazone This list may not describe all possible interactions. Give your health care provider a list of all the medicines, herbs, non-prescription drugs, or dietary supplements you use. Also tell them if you smoke, drink alcohol, or use illegal drugs. Some  items may interact with your medicine. What should I watch for while using this medicine? Tell your doctor or health care professional if your symptoms do not improve or if you get new symptoms. Drink several glasses of water a day. If you are taking this medicine for a long time, visit your doctor for regular checks on your progress. If you are diabetic, you may get a false positive result for sugar in your urine with certain brands of urine tests. Check with your doctor. What side effects may I notice from receiving this medicine? Side effects that you should report to your doctor or health care professional as soon as possible:  allergic reactions like skin rash or hives, swelling of the face, lips, or tongue  chest pain  cough  difficulty breathing  dizziness, drowsiness  fever or infection  joint aches or pains  pale or blue-tinted skin  redness, blistering, peeling or loosening of the skin, including inside the mouth  tingling, burning, pain, or numbness in hands or feet  unusual bleeding or bruising  unusually weak or tired  yellowing  of eyes or skin Side effects that usually do not require medical attention (report to your doctor or health care professional if they continue or are bothersome):  dark urine  diarrhea  headache  loss of appetite  nausea or vomiting  temporary hair loss This list may not describe all possible side effects. Call your doctor for medical advice about side effects. You may report side effects to FDA at 1-800-FDA-1088. Where should I keep my medicine? Keep out of the reach of children. Store at room temperature between 15 and 30 degrees C (59 and 86 degrees F). Protect from light. Throw away any unused medicine after the expiration date. NOTE: This sheet is a summary. It may not cover all possible information. If you have questions about this medicine, talk to your doctor, pharmacist, or health care provider.  2020 Elsevier/Gold  Standard (2007-10-27 15:56:47)

## 2020-03-30 ENCOUNTER — Inpatient Hospital Stay (HOSPITAL_BASED_OUTPATIENT_CLINIC_OR_DEPARTMENT_OTHER): Payer: Medicare Other | Admitting: Oncology

## 2020-03-30 ENCOUNTER — Encounter: Payer: Self-pay | Admitting: Adult Health

## 2020-03-30 ENCOUNTER — Encounter: Payer: Self-pay | Admitting: Oncology

## 2020-03-30 DIAGNOSIS — D500 Iron deficiency anemia secondary to blood loss (chronic): Secondary | ICD-10-CM | POA: Diagnosis not present

## 2020-03-30 DIAGNOSIS — K921 Melena: Secondary | ICD-10-CM | POA: Diagnosis not present

## 2020-03-30 LAB — POCT URINALYSIS DIPSTICK
Bilirubin, UA: NEGATIVE
Blood, UA: NEGATIVE
Glucose, UA: NEGATIVE
Ketones, UA: NEGATIVE
Leukocytes, UA: NEGATIVE
Nitrite, UA: POSITIVE
Protein, UA: NEGATIVE
Spec Grav, UA: 1.02 (ref 1.010–1.025)
Urobilinogen, UA: 0.2 E.U./dL
pH, UA: 5 (ref 5.0–8.0)

## 2020-03-30 NOTE — Progress Notes (Signed)
Patient contacted for Mychart visit. No new concerns voiced.  

## 2020-03-30 NOTE — Progress Notes (Signed)
Susan Howard HEMATOLOGY-ONCOLOGY TeleHEALTH VISIT PROGRESS NOTE  I connected with Susan Howard on 03/30/20 at  2:00 PM EST by video enabled telemedicine visit and verified that I am speaking with the correct person using two identifiers. I discussed the limitations, risks, security and privacy concerns of performing an evaluation and management service by telemedicine and the availability of in-person appointments.. The patient expressed understanding and agreed to proceed.   Other persons participating in the visit and their role in the encounter:  None  Patient's location: Home  Provider's location: office Chief Complaint: IDA   INTERVAL HISTORY [name] is a 49 y.o. female who has above history reviewed by me today presents for follow up visit for management of IDA I attempted to connect the patient for visual enabled telehealth visit.  Due to the technical difficulties with video,  Patient was transitioned to audio only visit.  Recently being treated for UTI. Energy level has been okay except slightly decreased exercise endurance.  Occasionally she has some blood in her stool which she attributes to history of hemorrhoids  She has resumed menstrual.  For about 2 times.  Menses not as heavy as previously.  Review of Systems  Constitutional: Negative for appetite change, chills, fatigue and fever.  HENT:   Negative for hearing loss and voice change.   Eyes: Negative for eye problems.  Respiratory: Negative for chest tightness and cough.   Cardiovascular: Negative for chest pain.  Gastrointestinal: Negative for abdominal distention, abdominal pain and blood in stool.  Endocrine: Negative for hot flashes.  Genitourinary: Negative for difficulty urinating and frequency.   Musculoskeletal: Negative for arthralgias.  Skin: Negative for itching and rash.  Neurological: Negative for extremity weakness.  Hematological: Negative for adenopathy.  Psychiatric/Behavioral: Negative for  confusion.    Past Medical History:  Diagnosis Date  . Family history of breast cancer    declined BRCA testing 2013  . Iron deficiency anemia due to chronic blood loss 11/09/2018  . MRSA (methicillin resistant Staphylococcus aureus)   . Spina bifida ([place])   . Status post implantation of artificial urinary sphincter 2006   neurogenic bladder  . UTI (urinary tract infection) 12/2016   Past Surgical History:  Procedure Laterality Date  . BACK SURGERY     for spinal bifida  . CESAREAN SECTION  [phone]   G4P4  . 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 HIP SURGERY  2012   [facility]-transfer of muscle from left buttock  . TUBAL LIGATION  1996  . URINARY SPHINCTER REVISION  10/18/2016   urinary sphincter prosthesis for urinary incontinence with multiple revisions. Last revision 09/2014    Family History  Problem Relation Age of Onset  . Breast cancer Mother 53  . Hypertension Mother   . Leukemia Father 28  . Neural tube defect Son        spinal bifida  . Breast cancer Maternal Grandmother 31  . Hypertension Maternal Grandmother   . Heart disease Maternal Grandfather   . Heart attack Maternal Grandfather 47    Social History   Socioeconomic History  . Marital status: Married    Spouse name: Not on file  . Number of children: 4  . Years of education: Bachelor's  . Highest education level: Not on file  Occupational History  . Occupation: Therapist, sports  Tobacco Use  . Smoking status: Never Smoker  . Smokeless tobacco: Never Used  Vaping Use  .  Vaping Use: Never used  Substance and Sexual Activity  . Alcohol use: No    Alcohol/week: 0.0 standard drinks  . Drug use: No  . Sexual activity: Yes    Partners: Male    Birth control/protection: Surgical    Comment: tubal ligation  Other Topics Concern  . Not on file  Social History Narrative  . Not on file   Social Determinants of Health   Financial Resource Strain: Low Risk   .  Difficulty of Paying Living Expenses: Not hard at all  Food Insecurity: No Food Insecurity  . Worried About Charity fundraiser in the Last Year: Never true  . Ran Out of Food in the Last Year: Never true  Transportation Needs: No Transportation Needs  . Lack of Transportation (Medical): No  . Lack of Transportation (Non-Medical): No  Physical Activity: Sufficiently Active  . Days of Exercise per Week: 6 days  . Minutes of Exercise per Session: 60 min  Stress: No Stress Concern Present  . Feeling of Stress : Not at all  Social Connections: Moderately Integrated  . Frequency of Communication with Friends and Family: More than three times a week  . Frequency of Social Gatherings with Friends and Family: More than three times a week  . Attends Religious Services: More than 4 times per year  . Active Member of Clubs or Organizations: No  . Attends Archivist Meetings: Never  . Marital Status: Married  Human resources officer Violence: Not At Risk  . Fear of Current or Ex-Partner: No  . Emotionally Abused: No  . Physically Abused: No  . Sexually Abused: No    Current Outpatient Medications on File Prior to Visit  Medication Sig Dispense Refill  . Multiple Vitamin (MULTIVITAMIN) capsule Take 1 capsule by mouth daily.    . nitrofurantoin, macrocrystal-monohydrate, (MACROBID) 100 MG capsule Take 1 capsule (100 mg total) by mouth 2 (two) times daily. 14 capsule 0   No current facility-administered medications on file prior to visit.    Allergies  Allergen Reactions  . Latex Other (See Comments) and Rash    Respiratory distress: patient allergic to ALL LATEX  . Other Itching and Other (See Comments)    Allergen - (blue) chux pads Reaction - sneezing Allergen - (blue) chux pads Reaction - sneezing Allergen - chlorine Reaction - sneezing   . Clindamycin/Lincomycin Rash  . Septra [Sulfamethoxazole-Trimethoprim] Rash       Observations/Objective: Today's Vitals   03/30/20 1347   PainSc: 0-No pain   There is no height or weight on file to calculate BMI.  Physical Exam  CBC    Component Value Date/Time   WBC 4.8 03/28/2020 1346   RBC 4.44 03/28/2020 1346   HGB 13.6 03/28/2020 1346   HGB 6.8 (LL) 11/04/2018 1024   HCT 40.2 03/28/2020 1346   HCT 23.9 (L) 11/04/2018 1024   PLT 256 03/28/2020 1346   PLT 294 11/04/2018 1024   MCV 90.5 03/28/2020 1346   MCV 64 (L) 11/04/2018 1024   MCH 30.6 03/28/2020 1346   MCHC 33.8 03/28/2020 1346   RDW 13.0 03/28/2020 1346   RDW 18.1 (H) 11/04/2018 1024   LYMPHSABS 1.5 03/28/2020 1346   LYMPHSABS 1.4 11/04/2018 1024   MONOABS 0.5 03/28/2020 1346   EOSABS 0.1 03/28/2020 1346   EOSABS 0.0 11/04/2018 1024   BASOSABS 0.1 03/28/2020 1346   BASOSABS 0.1 11/04/2018 1024    CMP     Component Value Date/Time   NA 140  11/04/2018 1024   K 4.4 11/04/2018 1024   CL 105 11/04/2018 1024   CO2 21 11/04/2018 1024   GLUCOSE 89 11/04/2018 1024   BUN 11 11/04/2018 1024   CREATININE 0.82 11/04/2018 1024   CALCIUM 9.2 11/04/2018 1024   PROT 6.6 11/04/2018 1024   ALBUMIN 4.3 11/04/2018 1024   AST 18 11/04/2018 1024   ALT 11 11/04/2018 1024   ALKPHOS 45 11/04/2018 1024   BILITOT 0.2 11/04/2018 1024   GFRNONAA 85 11/04/2018 1024   GFRAA 98 11/04/2018 1024     Assessment and Plan: 1. Blood in the stool   2. Iron deficiency anemia due to chronic blood loss     Iron deficiency anemia  Labs are reviewed and discussed with patient. Has stable and normal hemoglobin level at 13.6 patient Ferritin level has decreased to 8. Patient has been on oral iron supplementation Recommend patient to proceed with IV Venofer 200 mg x 1.  Blood in the stool, questionable hemorrhoid bleeding.  She has turned 49 last month.  Recommend patient to proceed with colonoscopy.  I will refer her to establish care with Dr. Marius Ditch. Follow Up Instructions: 6 months   I discussed the assessment and treatment plan with the patient. The patient was  provided an opportunity to ask questions and all were answered. The patient agreed with the plan and demonstrated an understanding of the instructions.  The patient was advised to call back or seek an in-person evaluation if the symptoms worsen or if the condition fails to improve as anticipated.    Earlie Server, MD 03/30/2020 9:11 PM

## 2020-04-05 LAB — URINE CULTURE

## 2020-04-06 ENCOUNTER — Other Ambulatory Visit: Payer: Self-pay

## 2020-04-06 ENCOUNTER — Inpatient Hospital Stay: Payer: Medicare Other

## 2020-04-06 VITALS — BP 145/86 | HR 80 | Resp 18

## 2020-04-06 DIAGNOSIS — D500 Iron deficiency anemia secondary to blood loss (chronic): Secondary | ICD-10-CM | POA: Diagnosis not present

## 2020-04-06 MED ORDER — IRON SUCROSE 20 MG/ML IV SOLN
200.0000 mg | Freq: Once | INTRAVENOUS | Status: AC
  Administered 2020-04-06: 200 mg via INTRAVENOUS
  Filled 2020-04-06: qty 10

## 2020-04-06 MED ORDER — SODIUM CHLORIDE 0.9 % IV SOLN
INTRAVENOUS | Status: DC
  Filled 2020-04-06: qty 250

## 2020-04-06 NOTE — Progress Notes (Signed)
Pt received prescribed treatment in clinic, pt stable at d/c. 

## 2020-04-06 NOTE — Progress Notes (Signed)
Should be susceptible to Nitrofurantoin.

## 2020-04-19 ENCOUNTER — Ambulatory Visit: Payer: Medicare Other | Admitting: Adult Health

## 2020-05-05 DIAGNOSIS — N343 Urethral syndrome, unspecified: Secondary | ICD-10-CM | POA: Diagnosis not present

## 2020-05-05 DIAGNOSIS — R030 Elevated blood-pressure reading, without diagnosis of hypertension: Secondary | ICD-10-CM | POA: Diagnosis not present

## 2020-05-05 DIAGNOSIS — N10 Acute tubulo-interstitial nephritis: Secondary | ICD-10-CM | POA: Diagnosis not present

## 2020-05-23 ENCOUNTER — Other Ambulatory Visit: Payer: Self-pay

## 2020-05-23 ENCOUNTER — Encounter: Payer: Self-pay | Admitting: Gastroenterology

## 2020-05-23 ENCOUNTER — Ambulatory Visit: Payer: Medicare Other | Admitting: Gastroenterology

## 2020-05-23 VITALS — BP 127/85 | HR 57 | Ht 61.0 in | Wt 110.2 lb

## 2020-05-23 DIAGNOSIS — K921 Melena: Secondary | ICD-10-CM | POA: Diagnosis not present

## 2020-05-23 MED ORDER — NA SULFATE-K SULFATE-MG SULF 17.5-3.13-1.6 GM/177ML PO SOLN: 1.0000 | Freq: Once | ORAL | 0 refills | Status: AC

## 2020-05-23 NOTE — Progress Notes (Signed)
Jonathon Bellows MD, MRCP(U.K) [address]  [place]  [place], [place] [address]  Main: [phone]  Fax: [phone]   Gastroenterology Consultation  Referring Provider:     Florian Buff* Primary Care Physician:  Mar Daring, PA-C Primary Gastroenterologist:  Dr. Jonathon Bellows  Reason for Consultation:    Blood in the stool        HPI:   Susan Howard is a 50 y.o. y/o female referred for consultation & management  by  Mar Daring, PA-C.  She has been referred for blood in the stool.  She follows with Dr. Tasia Catchings in hematology for iron deficiency anemia.  Previously had heavy menstrual.'s.  Receiving IV iron.  03/28/2020: Iron studies normal, ferritin 8, hemoglobin 13.6 g.  She states that she has had rectal bleeding for many years.  Usually worse when she runs.  Associated mild discomfort.  She suffers from spina bifid and has a artificial urethral sphincter.  No prior colonoscopy.  No family history of colon cancer.  Last episode of rectal bleeding which was significantly large was a few weeks back where she had an entire sanitary pad soaking with blood.  Past Medical History:  Diagnosis Date  . Family history of breast cancer    declined BRCA testing 2013  . Iron deficiency anemia due to chronic blood loss 11/09/2018  . MRSA (methicillin resistant Staphylococcus aureus)   . Spina bifida ([place])   . Status post implantation of artificial urinary sphincter 2006   neurogenic bladder  . UTI (urinary tract infection) 12/2016    Past Surgical History:  Procedure Laterality Date  . BACK SURGERY     for spinal bifida  . CESAREAN SECTION  [phone]   G4P4  . 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 HIP SURGERY  2012   [facility]-transfer of muscle from left buttock  . TUBAL LIGATION  1996  . URINARY SPHINCTER REVISION  10/18/2016   urinary sphincter prosthesis for urinary incontinence with multiple  revisions. Last revision 09/2014    Prior to Admission medications   Medication Sig Start Date End Date Taking? Authorizing Provider  Multiple Vitamin (MULTIVITAMIN) capsule Take 1 capsule by mouth daily.    [provider]  nitrofurantoin, macrocrystal-monohydrate, (MACROBID) 100 MG capsule Take 1 capsule (100 mg total) by mouth 2 (two) times daily. 03/29/20   Flinchum, Kelby Aline, FNP    Family History  Problem Relation Age of Onset  . Breast cancer Mother 102  . Hypertension Mother   . Leukemia Father 70  . Neural tube defect Son        spinal bifida  . Breast cancer Maternal Grandmother 12  . Hypertension Maternal Grandmother   . Heart disease Maternal Grandfather   . Heart attack Maternal Grandfather 42     Social History   Tobacco Use  . Smoking status: Never Smoker  . Smokeless tobacco: Never Used  Vaping Use  . Vaping Use: Never used  Substance Use Topics  . Alcohol use: No    Alcohol/week: 0.0 standard drinks  . Drug use: No    Allergies as of 05/23/2020 - Review Complete 03/30/2020  Allergen Reaction Noted  . Latex Other (See Comments) and Rash 09/27/2014  . Other Itching and Other (See Comments)   . Clindamycin/lincomycin Rash 09/27/2014  . Septra [sulfamethoxazole-trimethoprim] Rash 09/27/2014    Review of Systems:    All systems reviewed  and negative except where noted in HPI.   Physical Exam:  There were no vitals taken for this visit. No LMP recorded. Psych:  Alert and cooperative. Normal mood and affect. General:   Alert,  Well-developed, well-nourished, pleasant and cooperative in NAD Head:  Normocephalic and atraumatic. Eyes:  Sclera clear, no icterus.   Conjunctiva pink.. Lungs:  Respirations even and unlabored.  Clear throughout to auscultation.   No wheezes, crackles, or rhonchi. No acute distress. Heart:  Regular rate and rhythm; no murmurs, clicks, rubs, or gallops. Abdomen:  Normal bowel sounds.  No bruits.  Soft, non-tender and  non-distended without masses, hepatosplenomegaly or hernias noted.  No guarding or rebound tenderness.    Neurologic:  Alert and oriented x3;  grossly normal neurologically. Psych:  Alert and cooperative. Normal mood and affect.  Imaging Studies: No results found.  Assessment and Plan:   Susan Howard is a 50 y.o. y/o female has been referred for blood in stool.  Follows with Dr. Tasia Catchings in hematology for iron deficiency anemia secondary to menorrhagia.  Received IV iron.  History is suggestive of significant bleeding per hemorrhoids.  I will examine her anal area and perform a rectal exam in detail when she comes in for her colonoscopy.  Plan 1.  Colonoscopy to evaluate rectal bleeding 2.  Based on her history likely hemorrhoidal and following colonoscopy will bring her back into the office to consider banding of her internal hemorrhoids.  She has tried conservative management in the past which has failed.   I have discussed alternative options, risks & benefits,  which include, but are not limited to, bleeding, infection, perforation,respiratory complication & drug reaction.  The patient agrees with this plan & written consent will be obtained.     Follow up in 4 to 6 weeks in office visit  Dr Jonathon Bellows MD,MRCP(U.K)

## 2020-06-05 ENCOUNTER — Other Ambulatory Visit: Payer: Medicare Other

## 2020-06-06 ENCOUNTER — Other Ambulatory Visit
Admission: RE | Admit: 2020-06-06 | Discharge: 2020-06-06 | Disposition: A | Discharge status: Home / Self Care | Payer: Medicare Other | Source: Ambulatory Visit | Attending: Gastroenterology | Admitting: Gastroenterology

## 2020-06-06 ENCOUNTER — Other Ambulatory Visit: Payer: Self-pay

## 2020-06-06 DIAGNOSIS — Z01812 Encounter for preprocedural laboratory examination: Secondary | ICD-10-CM | POA: Insufficient documentation

## 2020-06-06 DIAGNOSIS — Z20822 Contact with and (suspected) exposure to covid-19: Secondary | ICD-10-CM | POA: Insufficient documentation

## 2020-06-06 LAB — SARS CORONAVIRUS 2 (TAT 6-24 HRS): SARS Coronavirus 2: NEGATIVE

## 2020-06-07 ENCOUNTER — Encounter: Payer: Self-pay | Admitting: Gastroenterology

## 2020-06-07 ENCOUNTER — Ambulatory Visit: Payer: Medicare Other | Admitting: Certified Registered Nurse Anesthetist

## 2020-06-07 ENCOUNTER — Encounter
Admission: RE | Disposition: A | Discharge status: Home / Self Care | Payer: Self-pay | Source: Ambulatory Visit | Attending: Gastroenterology

## 2020-06-07 ENCOUNTER — Ambulatory Visit
Admission: RE | Admit: 2020-06-07 | Discharge: 2020-06-07 | Disposition: A | Discharge status: Home / Self Care | Payer: Medicare Other | Source: Ambulatory Visit | Attending: Gastroenterology | Admitting: Gastroenterology

## 2020-06-07 DIAGNOSIS — Z881 Allergy status to other antibiotic agents status: Secondary | ICD-10-CM | POA: Diagnosis not present

## 2020-06-07 DIAGNOSIS — Q059 Spina bifida, unspecified: Secondary | ICD-10-CM | POA: Insufficient documentation

## 2020-06-07 DIAGNOSIS — K625 Hemorrhage of anus and rectum: Secondary | ICD-10-CM | POA: Diagnosis not present

## 2020-06-07 DIAGNOSIS — K641 Second degree hemorrhoids: Secondary | ICD-10-CM | POA: Insufficient documentation

## 2020-06-07 DIAGNOSIS — Z882 Allergy status to sulfonamides status: Secondary | ICD-10-CM | POA: Diagnosis not present

## 2020-06-07 DIAGNOSIS — Z8249 Family history of ischemic heart disease and other diseases of the circulatory system: Secondary | ICD-10-CM | POA: Diagnosis not present

## 2020-06-07 DIAGNOSIS — K648 Other hemorrhoids: Secondary | ICD-10-CM | POA: Diagnosis not present

## 2020-06-07 DIAGNOSIS — Z79899 Other long term (current) drug therapy: Secondary | ICD-10-CM | POA: Insufficient documentation

## 2020-06-07 DIAGNOSIS — Z803 Family history of malignant neoplasm of breast: Secondary | ICD-10-CM | POA: Diagnosis not present

## 2020-06-07 DIAGNOSIS — Z91040 Latex allergy status: Secondary | ICD-10-CM | POA: Insufficient documentation

## 2020-06-07 DIAGNOSIS — K921 Melena: Secondary | ICD-10-CM | POA: Diagnosis not present

## 2020-06-07 HISTORY — PX: COLONOSCOPY WITH PROPOFOL: SHX5780

## 2020-06-07 LAB — POCT PREGNANCY, URINE: Preg Test, Ur: NEGATIVE

## 2020-06-07 SURGERY — COLONOSCOPY WITH PROPOFOL
Anesthesia: General

## 2020-06-07 MED ORDER — PROPOFOL 10 MG/ML IV BOLUS
INTRAVENOUS | Status: AC
  Filled 2020-06-07: qty 40

## 2020-06-07 MED ORDER — PROPOFOL 10 MG/ML IV BOLUS
INTRAVENOUS | Status: DC | PRN
Start: 2020-06-07 — End: 2020-06-07
  Administered 2020-06-07: 20 mg via INTRAVENOUS
  Administered 2020-06-07: 60 mg via INTRAVENOUS

## 2020-06-07 MED ORDER — SODIUM CHLORIDE 0.9 % IV SOLN
INTRAVENOUS | Status: DC
  Administered 2020-06-07: 1000 mL via INTRAVENOUS

## 2020-06-07 MED ORDER — LIDOCAINE HCL (PF) 2 % IJ SOLN
INTRAMUSCULAR | Status: AC
  Filled 2020-06-07: qty 5

## 2020-06-07 MED ORDER — LIDOCAINE HCL (CARDIAC) PF 100 MG/5ML IV SOSY
PREFILLED_SYRINGE | INTRAVENOUS | Status: DC | PRN
  Administered 2020-06-07: 50 mg via INTRAVENOUS

## 2020-06-07 MED ORDER — PROPOFOL 500 MG/50ML IV EMUL
INTRAVENOUS | Status: DC | PRN
  Administered 2020-06-07: 150 ug/kg/min via INTRAVENOUS

## 2020-06-07 NOTE — Transfer of Care (Signed)
Immediate Anesthesia Transfer of Care Note  Patient: Susan Howard  Procedure(s) Performed: COLONOSCOPY WITH PROPOFOL (N/A )  Patient Location: PACU  Anesthesia Type:General  Level of Consciousness: drowsy  Airway & Oxygen Therapy: Patient Spontanous Breathing  Post-op Assessment: Report given to RN and Post -op Vital signs reviewed and stable  Post vital signs: Reviewed and stable  Last Vitals:  Vitals Value Taken Time  BP 112/76 06/07/20 1022  Temp    Pulse 58 06/07/20 1024  Resp 20 06/07/20 1024  SpO2 98 % 06/07/20 1024  Vitals shown include unvalidated device data.  Last Pain:  Vitals:   06/07/20 0924  TempSrc: Temporal  PainSc: 0-No pain         Complications: No complications documented.

## 2020-06-07 NOTE — Op Note (Signed)
[facility] Gastroenterology Patient Name: Susan Howard Procedure Date: 06/07/2020 10:01 AM MRN: [id number] Account #: [id number] Date of Birth: Oct 17, 1970 Admit Type: Outpatient Age: 50 Room: [facility] ENDO ROOM 3 Gender: Female Note Status: Finalized Procedure:             Colonoscopy Indications:           Rectal bleeding Providers:             Jonathon Bellows MD, MD Referring MD:          Mar Daring (Referring MD) Medicines:             Monitored Anesthesia Care Complications:         No immediate complications. Procedure:             Pre-Anesthesia Assessment:                        - Prior to the procedure, a History and Physical was                         performed, and patient medications, allergies and                         sensitivities were reviewed. The patient's tolerance                         of previous anesthesia was reviewed.                        - The risks and benefits of the procedure and the                         sedation options and risks were discussed with the                         patient. All questions were answered and informed                         consent was obtained.                        - ASA Grade Assessment: II - A patient with mild                         systemic disease.                        After obtaining informed consent, the colonoscope was                         passed under direct vision. Throughout the procedure,                         the patient's blood pressure, pulse, and oxygen                         saturations were monitored continuously. The                         Colonoscope was introduced through the anus and  advanced to the the cecum, identified by the                         appendiceal orifice. The colonoscopy was performed                         with ease. The patient tolerated the procedure well.                         The quality of the bowel preparation  was excellent. Findings:      The perianal and digital rectal examinations were normal.      Non-bleeding internal hemorrhoids were found during endoscopy. The       hemorrhoids were medium-sized and Grade II (internal hemorrhoids that       prolapse but reduce spontaneously).      The exam was otherwise without abnormality. Impression:            - Non-bleeding internal hemorrhoids.                        - The examination was otherwise normal.                        - No specimens collected. Recommendation:        - Discharge patient to home (with escort).                        - Resume previous diet.                        - Continue present medications.                        - Repeat colonoscopy in 10 years for screening                         purposes.                        - Return to my office as previously scheduled. Procedure Code(s):     --- Professional ---                        [phone], Colonoscopy, flexible; diagnostic, including                         collection of specimen(s) by brushing or washing, when                         performed (separate procedure) Diagnosis Code(s):     --- Professional ---                        K64.1, Second degree hemorrhoids                        K62.5, Hemorrhage of anus and rectum CPT copyright 2019 American Medical Association. All rights reserved. The codes documented in this report are preliminary and upon coder review may  be revised to meet current compliance requirements. Jonathon Bellows, MD Jonathon Bellows MD, MD 06/07/2020 10:20:59 AM This report has been signed  electronically. Number of Addenda: 0 Note Initiated On: 06/07/2020 10:01 AM Scope Withdrawal Time: 0 hours 8 minutes 24 seconds  Total Procedure Duration: 0 hours 12 minutes 27 seconds  Estimated Blood Loss:  Estimated blood loss: none.      [facility]

## 2020-06-07 NOTE — Anesthesia Preprocedure Evaluation (Signed)
Anesthesia Evaluation  Patient identified by MRN, date of birth, ID band Patient awake    Reviewed: Allergy & Precautions, H&P , NPO status , Patient's Chart, lab work & pertinent test results  Airway Mallampati: II  TM Distance: >3 FB     Dental no notable dental hx.  Braces:   Pulmonary neg pulmonary ROS,    Pulmonary exam normal breath sounds clear to auscultation       Cardiovascular negative cardio ROS Normal cardiovascular exam Rhythm:Regular Rate:Normal     Neuro/Psych negative neurological ROS  negative psych ROS   GI/Hepatic negative GI ROS, Neg liver ROS,   Endo/Other  negative endocrine ROS  Renal/GU negative Renal ROS  negative genitourinary   Musculoskeletal negative musculoskeletal ROS (+)   Abdominal   Peds negative pediatric ROS (+)  Hematology negative hematology ROS (+)   Anesthesia Other Findings Past Medical History: No date: Family history of breast cancer     Comment:  declined BRCA testing 2013 11/09/2018: Iron deficiency anemia due to chronic blood loss No date: MRSA (methicillin resistant Staphylococcus aureus) No date: Spina bifida ([place]) 2006: Status post implantation of artificial urinary sphincter     Comment:  neurogenic bladder 12/2016: UTI (urinary tract infection)   Reproductive/Obstetrics negative OB ROS                             Anesthesia Physical Anesthesia Plan  ASA: II  Anesthesia Plan: General   Post-op Pain Management:    Induction: Intravenous  PONV Risk Score and Plan: 3 and Propofol infusion  Airway Management Planned: Nasal Cannula  Additional Equipment:   Intra-op Plan:   Post-operative Plan:   Informed Consent: I have reviewed the patients History and Physical, chart, labs and discussed the procedure including the risks, benefits and alternatives for the proposed anesthesia with the patient or authorized representative  who has indicated his/her understanding and acceptance.     Dental advisory given  Plan Discussed with: CRNA, Anesthesiologist and Surgeon  Anesthesia Plan Comments:         Anesthesia Quick Evaluation

## 2020-06-07 NOTE — H&P (Signed)
   Susan Anna, MD [address], [address], [place], [place], [address] [address], [address], [place], [place], [address] Phone: [phone]  Fax: [phone]  Primary Care Physician:  Burnette, Jennifer M, PA-C   Pre-Procedure History & Physical: HPI:  Susan Howard is a 50 y.o. female is here for an colonoscopy.   Past Medical History:  Diagnosis Date  . Family history of breast cancer    declined BRCA testing 2013  . Iron deficiency anemia due to chronic blood loss 11/09/2018  . MRSA (methicillin resistant Staphylococcus aureus)   . Spina bifida ([facility])   . Status post implantation of artificial urinary sphincter 2006   neurogenic bladder  . UTI (urinary tract infection) 12/2016    Past Surgical History:  Procedure Laterality Date  . BACK SURGERY     for spinal bifida  . CESAREAN SECTION  1992,1993,1995,1996   G4P4  . 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 HIP SURGERY  2012   [facility]-transfer of muscle from left buttock  . TUBAL LIGATION  1996  . URINARY SPHINCTER REVISION  10/18/2016   urinary sphincter prosthesis for urinary incontinence with multiple revisions. Last revision 09/2014    Prior to Admission medications   Medication Sig Start Date End Date Taking? Authorizing Provider  Multiple Vitamin (MULTIVITAMIN) capsule Take 1 capsule by mouth daily.   Yes [provider]  nitrofurantoin, macrocrystal-monohydrate, (MACROBID) 100 MG capsule Take 1 capsule (100 mg total) by mouth 2 (two) times daily. Patient not taking: Reported on 06/07/2020 03/29/20   Flinchum, Michelle S, FNP    Allergies as of 05/23/2020 - Review Complete 05/23/2020  Allergen Reaction Noted  . Latex Other (See Comments) and Rash 09/27/2014  . Other Itching and Other (See Comments)   . Clindamycin/lincomycin Rash 09/27/2014  . Septra [sulfamethoxazole-trimethoprim] Rash 09/27/2014    Family History  Problem Relation Age of Onset  .  Breast cancer Mother 55  . Hypertension Mother   . Leukemia Father 74  . Neural tube defect Son        spinal bifida  . Breast cancer Maternal Grandmother 40  . Hypertension Maternal Grandmother   . Heart disease Maternal Grandfather   . Heart attack Maternal Grandfather 55    Social History   Socioeconomic History  . Marital status: Married    Spouse name: Not on file  . Number of children: 4  . Years of education: Bachelor's  . Highest education level: Not on file  Occupational History  . Occupation: RN  Tobacco Use  . Smoking status: Never Smoker  . Smokeless tobacco: Never Used  Vaping Use  . Vaping Use: Never used  Substance and Sexual Activity  . Alcohol use: No    Alcohol/week: 0.0 standard drinks  . Drug use: No  . Sexual activity: Yes    Partners: Male    Birth control/protection: Surgical    Comment: tubal ligation  Other Topics Concern  . Not on file  Social History Narrative  . Not on file   Social Determinants of Health   Financial Resource Strain: Low Risk   . Difficulty of Paying Living Expenses: Not hard at all  Food Insecurity: No Food Insecurity  . Worried About Running Out of Food in the Last Year: Never true  . Ran Out of Food in the Last Year: Never true  Transportation Needs: No Transportation Needs  . Lack of Transportation (  Medical): No  . Lack of Transportation (Non-Medical): No  Physical Activity: Sufficiently Active  . Days of Exercise per Week: 6 days  . Minutes of Exercise per Session: 60 min  Stress: No Stress Concern Present  . Feeling of Stress : Not at all  Social Connections: Moderately Integrated  . Frequency of Communication with Friends and Family: More than three times a week  . Frequency of Social Gatherings with Friends and Family: More than three times a week  . Attends Religious Services: More than 4 times per year  . Active Member of Clubs or Organizations: No  . Attends Club or Organization Meetings: Never  .  Marital Status: Married  Intimate Partner Violence: Not At Risk  . Fear of Current or Ex-Partner: No  . Emotionally Abused: No  . Physically Abused: No  . Sexually Abused: No    Review of Systems: See HPI, otherwise negative ROS  Physical Exam: BP (!) 135/96   Pulse (!) 49   Temp 98 F (36.7 C)   Resp 14   Ht 5' 1" (1.549 m)   Wt 47.2 kg   SpO2 100%   BMI 19.65 kg/m  General:   Alert,  pleasant and cooperative in NAD Head:  Normocephalic and atraumatic. Neck:  Supple; no masses or thyromegaly. Lungs:  Clear throughout to auscultation, normal respiratory effort.    Heart:  +S1, +S2, Regular rate and rhythm, No edema. Abdomen:  Soft, nontender and nondistended. Normal bowel sounds, without guarding, and without rebound.   Neurologic:  Alert and  oriented x4;  grossly normal neurologically.  Impression/Plan: Susan Howard is here for an colonoscopy to be performed for rectal bleeding.  Risks, benefits, limitations, and alternatives regarding  colonoscopy have been reviewed with the patient.  Questions have been answered.  All parties agreeable.   Susan Anna, MD  06/07/2020, 11:50 AM  

## 2020-06-07 NOTE — Anesthesia Postprocedure Evaluation (Signed)
Anesthesia Post Note  Patient: Susan Howard  Procedure(s) Performed: COLONOSCOPY WITH PROPOFOL (N/A )  Patient location during evaluation: Endoscopy Anesthesia Type: General Level of consciousness: awake Pain management: pain level controlled Vital Signs Assessment: post-procedure vital signs reviewed and stable Respiratory status: spontaneous breathing Cardiovascular status: stable Postop Assessment: no apparent nausea or vomiting Anesthetic complications: no   No complications documented.   Last Vitals:  Vitals:   06/07/20 1042 06/07/20 1052  BP: (!) 148/93 (!) 135/96  Pulse: (!) 48 (!) 49  Resp: (!) 8 14  Temp:    SpO2: 99% 100%    Last Pain:  Vitals:   06/07/20 0924  TempSrc: Temporal  PainSc: 0-No pain                 Neva Seat

## 2020-06-08 ENCOUNTER — Encounter: Payer: Self-pay | Admitting: Gastroenterology

## 2020-06-21 DIAGNOSIS — N390 Urinary tract infection, site not specified: Secondary | ICD-10-CM | POA: Diagnosis not present

## 2020-06-21 DIAGNOSIS — N319 Neuromuscular dysfunction of bladder, unspecified: Secondary | ICD-10-CM | POA: Diagnosis not present

## 2020-06-28 ENCOUNTER — Ambulatory Visit: Payer: Medicare Other | Admitting: Gastroenterology

## 2020-06-28 ENCOUNTER — Other Ambulatory Visit: Payer: Self-pay

## 2020-06-28 VITALS — BP 135/92 | HR 61 | Wt 110.0 lb

## 2020-06-28 DIAGNOSIS — K648 Other hemorrhoids: Secondary | ICD-10-CM | POA: Diagnosis not present

## 2020-06-28 DIAGNOSIS — K921 Melena: Secondary | ICD-10-CM | POA: Diagnosis not present

## 2020-06-28 NOTE — Progress Notes (Signed)
Jonathon Bellows MD, MRCP(U.K) [address]  [place]  [place], [place] [address]  Main: [phone]  Fax: [phone]   Primary Care Physician: Mar Daring, PA-C  Primary Gastroenterologist:  Dr. Jonathon Bellows   Follow up for rectal bleeding .   HPI: Susan Howard is a 50 y.o. female    Summary of history :  Referred and seen initially for blood in the stool on 05/23/2020.  She follows with Dr. Tasia Catchings in hematology for iron deficiency anemia.  Previously had heavy menstrual.'s.  Receiving IV iron.  03/28/2020: Iron studies normal, ferritin 8, hemoglobin 13.6 g.  She states that she has had rectal bleeding for many years.  Usually worse when she runs.  Associated mild discomfort.  She suffers from spina bifid and has a artificial urethral sphincter.  No prior colonoscopy.  No family history of colon cancer.  Last episode of rectal bleeding which was significantly large was a few weeks back where she had an entire sanitary pad soaking with blood.  Interval history  05/23/2020-06/28/2020  06/07/2020: Colonoscopy : internal hemorrhoids   She has been seen by GYN for uterine prolapse and menometrorrhagia.  Doing well after the colonoscopy.  Only has rectal bleeding when she exercises extensively.  Has tried conservative management of hemorrhoids but issues persist and hence wants banding of hemorrhoids Current Outpatient Medications  Medication Sig Dispense Refill  . Multiple Vitamin (MULTIVITAMIN) capsule Take 1 capsule by mouth daily.    . nitrofurantoin, macrocrystal-monohydrate, (MACROBID) 100 MG capsule Take 1 capsule (100 mg total) by mouth 2 (two) times daily. (Patient not taking: Reported on 06/07/2020) 14 capsule 0   No current facility-administered medications for this visit.    Allergies as of 06/28/2020 - Review Complete 06/07/2020  Allergen Reaction Noted  . Latex Other (See Comments) and Rash 09/27/2014  . Other Itching and Other (See Comments)   .  Clindamycin/lincomycin Rash 09/27/2014  . Septra [sulfamethoxazole-trimethoprim] Rash 09/27/2014    ROS:  General: Negative for anorexia, weight loss, fever, chills, fatigue, weakness. ENT: Negative for hoarseness, difficulty swallowing , nasal congestion. CV: Negative for chest pain, angina, palpitations, dyspnea on exertion, peripheral edema.  Respiratory: Negative for dyspnea at rest, dyspnea on exertion, cough, sputum, wheezing.  GI: See history of present illness. GU:  Negative for dysuria, hematuria, urinary incontinence, urinary frequency, nocturnal urination.  Endo: Negative for unusual weight change.    Physical Examination: Chaperone present in the room CMA Jovon  PROCEDURE NOTE: The patient presents with symptomatic grade 2 hemorrhoids, unresponsive to maximal medical therapy, requesting rubber band ligation of his/her hemorrhoidal disease.  All risks, benefits and alternative forms of therapy were described and informed consent was obtained.  In the Left Lateral Decubitus position (if anoscopy is performed) anoscopic examination revealed grade 2 hemorrhoids in the all position(s).   The decision was made to band the RA internal hemorrhoid, and the [place] was used to perform band ligation without complication.  Digital anorectal examination was then performed to assure proper positioning of the band, and to adjust the banded tissue as required.  The patient was discharged home without pain or other issues.  Dietary and behavioral recommendations were given and (if necessary - prescriptions were given), along with follow-up instructions.  The patient will return 4 weeks for follow-up and possible additional banding as required.  No complications were encountered and the patient tolerated the procedure well.      Imaging Studies: No results found.  Assessment  and Plan:   Susan Howard is a 50 y.o. y/o female here to follow up for rectal bleeding , colonoscopy  performed recently showed only internal hemorrhoids.  She has tried conservative management of hemorrhoids which has not resolved her issues of rectal bleeding.  Proceeded with banding of right anterior hemorrhoidal column.    Dr Jonathon Bellows  MD,MRCP [facility]) Follow up in 4 weeks for further banding

## 2020-07-18 ENCOUNTER — Other Ambulatory Visit: Payer: Self-pay

## 2020-07-18 ENCOUNTER — Ambulatory Visit
Admission: EM | Admit: 2020-07-18 | Discharge: 2020-07-18 | Disposition: A | Discharge status: Home / Self Care | Payer: Medicare Other | Attending: Family Medicine | Admitting: Family Medicine

## 2020-07-18 DIAGNOSIS — R300 Dysuria: Secondary | ICD-10-CM | POA: Insufficient documentation

## 2020-07-18 LAB — POCT URINALYSIS DIP (MANUAL ENTRY)
Bilirubin, UA: NEGATIVE
Blood, UA: NEGATIVE
Glucose, UA: NEGATIVE mg/dL
Ketones, POC UA: NEGATIVE mg/dL
Leukocytes, UA: NEGATIVE
Nitrite, UA: POSITIVE — AB
Protein Ur, POC: NEGATIVE mg/dL
Spec Grav, UA: 1.025 (ref 1.010–1.025)
Urobilinogen, UA: 0.2 E.U./dL
pH, UA: 7 (ref 5.0–8.0)

## 2020-07-18 MED ORDER — NITROFURANTOIN MONOHYD MACRO 100 MG PO CAPS: 100.0000 mg | ORAL_CAPSULE | Freq: Two times a day (BID) | ORAL | 0 refills | Status: DC

## 2020-07-18 NOTE — ED Triage Notes (Signed)
Pt reports having dysuria and urinary frequency x3 days.

## 2020-07-18 NOTE — Discharge Instructions (Addendum)
Take the Macrobid as prescribed Push fluids.  Culture pending.   If you need primary care I will be seeing patients probably in the next few months. [facility] in [place] [address] and [address].

## 2020-07-18 NOTE — ED Provider Notes (Signed)
[name]    CSN: [id number] Arrival date & time: 07/18/20  1049      History   Chief Complaint Chief Complaint  Patient presents with  . Dysuria    HPI Susan Howard is a 50 y.o. female.   Pt is a 50 year old female that presents with dysuria and urinary frequency x 3 days. Hx of recurrent UTI and artificial urinary sphincter. No bacl pain, flank pain, fever, N,V   Dysuria   Past Medical History:  Diagnosis Date  . Family history of breast cancer    declined BRCA testing 2013  . Iron deficiency anemia due to chronic blood loss 11/09/2018  . MRSA (methicillin resistant Staphylococcus aureus)   . Spina bifida ([place])   . Status post implantation of artificial urinary sphincter 2006   neurogenic bladder  . UTI (urinary tract infection) 12/2016    Patient Active Problem List   Diagnosis Date Noted  . Dysuria 03/29/2020  . Acute cystitis with hematuria 09/22/2019  . Menometrorrhagia 01/30/2019  . Iron deficiency anemia due to chronic blood loss 11/09/2018  . Bursitis of shoulder 11/08/2018  . Family history of breast cancer 01/20/2017  . History of methicillin resistant Staphylococcus aureus infection 12/06/2014  . Spina bifida of lumbar region [facility]) 10/11/2014    Past Surgical History:  Procedure Laterality Date  . BACK SURGERY     for spinal bifida  . CESAREAN SECTION  [phone]   G4P4  . COLONOSCOPY WITH PROPOFOL N/A 06/07/2020   Procedure: COLONOSCOPY WITH PROPOFOL;  Surgeon: Jonathon Bellows, MD;  Location[facility] ENDOSCOPY;  Service: Gastroenterology;  Laterality: N/A;  C-19 TEST  AM ON 06/06/2020  . 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 HIP SURGERY  2012   [facility]-transfer of muscle from left buttock  . TUBAL LIGATION  1996  . URINARY SPHINCTER REVISION  10/18/2016   urinary sphincter prosthesis for urinary incontinence with multiple revisions. Last revision 09/2014    OB History    Gravida  4    Para  4   Term  4   Preterm      AB      Living  4     SAB      IAB      Ectopic      Multiple      Live Births  4            Home Medications    Prior to Admission medications   Medication Sig Start Date End Date Taking? Authorizing Provider  Multiple Vitamin (MULTIVITAMIN) capsule Take 1 capsule by mouth daily.    [provider]  nitrofurantoin, macrocrystal-monohydrate, (MACROBID) 100 MG capsule Take 1 capsule (100 mg total) by mouth 2 (two) times daily. 07/18/20   Orvan July, NP    Family History Family History  Problem Relation Age of Onset  . Breast cancer Mother 66  . Hypertension Mother   . Leukemia Father 38  . Neural tube defect Son        spinal bifida  . Breast cancer Maternal Grandmother 42  . Hypertension Maternal Grandmother   . Heart disease Maternal Grandfather   . Heart attack Maternal Grandfather 50    Social History Social History   Tobacco Use  . Smoking status: Never Smoker  . Smokeless tobacco: Never Used  Vaping Use  . Vaping Use: Never used  Substance Use Topics  .  Alcohol use: No    Alcohol/week: 0.0 standard drinks  . Drug use: No     Allergies   Latex, Other, Clindamycin/lincomycin, and Septra [sulfamethoxazole-trimethoprim]   Review of Systems Review of Systems  Genitourinary: Positive for dysuria.     Physical Exam Triage Vital Signs ED Triage Vitals [07/18/20 1102]  Enc Vitals Group     BP (!) 159/99     Pulse Rate 79     Resp 18     Temp 98.1 F (36.7 C)     Temp Source Oral     SpO2 97 %     Weight 104 lb (47.2 kg)     Height _0  (1.549 m)     Head Circumference      Peak Flow      Pain Score 4     Pain Loc      Pain Edu?      Excl. in [place]?    No data found.  Updated Vital Signs BP (!) 159/99   Pulse 79   Temp 98.1 F (36.7 C) (Oral)   Resp 18   Ht _1  (1.549 m)   Wt 104 lb (47.2 kg)   SpO2 97%   BMI 19.65 kg/m   Visual Acuity Right Eye Distance:   Left Eye  Distance:   Bilateral Distance:    Right Eye Near:   Left Eye Near:    Bilateral Near:     Physical Exam Vitals and nursing note reviewed.  Constitutional:      General: She is not in acute distress.    Appearance: Normal appearance. She is not ill-appearing, toxic-appearing or diaphoretic.  HENT:     Head: Normocephalic.  Eyes:     Conjunctiva/sclera: Conjunctivae normal.  Pulmonary:     Effort: Pulmonary effort is normal.  Musculoskeletal:        General: Normal range of motion.     Cervical back: Normal range of motion.  Skin:    General: Skin is warm and dry.     Findings: No rash.  Neurological:     Mental Status: She is alert.  Psychiatric:        Mood and Affect: Mood normal.      UC Treatments / Results  Labs (all labs ordered are listed, but only abnormal results are displayed) Labs Reviewed  POCT URINALYSIS DIP (MANUAL ENTRY) - Abnormal; Notable for the following components:      Result Value   Clarity, UA cloudy (*)    Nitrite, UA Positive (*)    All other components within normal limits  URINE CULTURE    EKG   Radiology No results found.  Procedures Procedures (including critical care time)  Medications Ordered in UC Medications - No data to display  Initial Impression / Assessment and Plan / UC Course  I have reviewed the triage vital signs and the nursing notes.  Pertinent labs & imaging results that were available during my care of the patient were reviewed by me and considered in my medical decision making (see chart for details).     Dysuria Urine with positive nitrites and cloudy. Urine cloudy.  Sending for culture.  Will treat prophylactically with Macrobid based on history, urinalysis and symptoms. Recommended push fluids Culture pending Follow up as needed for continued or worsening symptoms  Final Clinical Impressions(s) / UC Diagnoses   Final diagnoses:  Dysuria     Discharge Instructions     Take the Macrobid as  prescribed Push fluids.  Culture pending.   If you need primary care I will be seeing patients probably in the next few months. [facility] in [place] off [address] and [address].     ED Prescriptions    Medication Sig Dispense Auth. Provider   nitrofurantoin, macrocrystal-monohydrate, (MACROBID) 100 MG capsule Take 1 capsule (100 mg total) by mouth 2 (two) times daily. 14 capsule Rolen Conger A, NP     PDMP not reviewed this encounter.   Orvan July, NP 07/18/20 1213

## 2020-07-21 LAB — URINE CULTURE: Culture: >=100000 — AB

## 2020-07-25 ENCOUNTER — Other Ambulatory Visit: Payer: Self-pay

## 2020-07-25 ENCOUNTER — Ambulatory Visit: Payer: Medicare Other | Admitting: Gastroenterology

## 2020-07-25 ENCOUNTER — Encounter: Payer: Self-pay | Admitting: Gastroenterology

## 2020-07-25 VITALS — BP 127/83 | HR 76 | Ht 61.0 in | Wt 109.2 lb

## 2020-07-25 DIAGNOSIS — K648 Other hemorrhoids: Secondary | ICD-10-CM

## 2020-07-25 NOTE — Progress Notes (Signed)
Patient follow-ups today for banding of hemorrhoids    Summary of history :   sHe is here for round 2 of banding of internal hemorrhoids that have failed conservative therapy.   First round: 06/28/2020 right anterior column banded    Interval history 06/28/2020-07/25/2020  Doing well after her last round of banding.  Very minimal bleeding on 1 episode.  Digital rectal exam performed in the presence of a chaperone. External anal findings: No gross abnormalities Internal findings: , No masses, no blood on glove noticed.    PROCEDURE NOTE: The patient presents with symptomatic grade 2 hemorrhoids, unresponsive to maximal medical therapy, requesting rubber band ligation of his/her hemorrhoidal disease.  All risks, benefits and alternative forms of therapy were described and informed consent was obtained.  In the Left Lateral Decubitus position (if anoscopy is performed) anoscopic examination revealed grade 2 hemorrhoids in the RP and LL position(s).   The decision was made to band the LL internal hemorrhoid, and the [place] was used to perform band ligation without complication.  Digital anorectal examination was then performed to assure proper positioning of the band, and to adjust the banded tissue as required.  The patient was discharged home without pain or other issues.  Dietary and behavioral recommendations were given and (if necessary - prescriptions were given), along with follow-up instructions.  The patient will return 4 weeks for follow-up and possible additional banding as required.  No complications were encountered and the patient tolerated the procedure well.   Plan:  1. Avoid constipation.  Commence on stool softeners if not already on  Follow-up: 4 weeks  Dr Jonathon Bellows MD,MRCP [facility]) Gastroenterology/Hepatology Pager: [phone]

## 2020-08-22 ENCOUNTER — Other Ambulatory Visit: Payer: Self-pay

## 2020-08-22 ENCOUNTER — Ambulatory Visit: Payer: Medicare Other | Admitting: Gastroenterology

## 2020-08-22 ENCOUNTER — Encounter: Payer: Self-pay | Admitting: Gastroenterology

## 2020-08-22 VITALS — BP 137/83 | HR 67 | Ht 61.0 in | Wt 109.6 lb

## 2020-08-22 DIAGNOSIS — K648 Other hemorrhoids: Secondary | ICD-10-CM

## 2020-08-22 NOTE — Progress Notes (Signed)
Patient follow-ups today for banding of hemorrhoids   Summary of history :   She  is here for round 3 of banding of internal hemorrhoids that have failed conservative therapy.   First round: 06/28/2020 right anterior column banded Second round : 07/25/2020: LL column banded    Interval history 07/25/2020-08/22/2020 Doing well after the second round of banding.  Did a 40 km   run and had no issues      Digital rectal exam performed in the presence of a chaperone. External anal findings: No gross abnormalities Internal findings: , No masses, no blood on glove noticed.    PROCEDURE NOTE: The patient presents with symptomatic grade 2 hemorrhoids, unresponsive to maximal medical therapy, requesting rubber band ligation of his/her hemorrhoidal disease.  All risks, benefits and alternative forms of therapy were described and informed consent was obtained.  In the Left Lateral Decubitus position (if anoscopy is performed) anoscopic examination revealed grade 2 hemorrhoids in the RP position(s).   The decision was made to band the RP internal hemorrhoid, and the [place] was used to perform band ligation without complication.  Digital anorectal examination was then performed to assure proper positioning of the band, and to adjust the banded tissue as required.  The patient was discharged home without pain or other issues.  Dietary and behavioral recommendations were given and (if necessary - prescriptions were given), along with follow-up instructions.  The patient will return     as needed for follow-up and possible additional banding as required.  No complications were encountered and the patient tolerated the procedure well.   Plan:  1. Avoid constipation.  Commence on stool softeners if not already on  Follow-up:as needed   Dr Jonathon Bellows MD,MRCP [facility]) Gastroenterology/Hepatology Pager: [phone]

## 2020-09-28 ENCOUNTER — Other Ambulatory Visit: Payer: Medicare Other

## 2020-09-28 ENCOUNTER — Inpatient Hospital Stay: Payer: Medicare Other | Attending: Oncology

## 2020-09-28 ENCOUNTER — Other Ambulatory Visit: Payer: Self-pay

## 2020-09-28 DIAGNOSIS — D500 Iron deficiency anemia secondary to blood loss (chronic): Secondary | ICD-10-CM | POA: Insufficient documentation

## 2020-09-28 LAB — CBC WITH DIFFERENTIAL/PLATELET
Abs Immature Granulocytes: 0.02 10*3/uL (ref 0.00–0.07)
Basophils Absolute: 0.1 10*3/uL (ref 0.0–0.1)
Basophils Relative: 1 %
Eosinophils Absolute: 0 10*3/uL (ref 0.0–0.5)
Eosinophils Relative: 0 %
HCT: 42.7 % (ref 36.0–46.0)
Hemoglobin: 14.5 g/dL (ref 12.0–15.0)
Immature Granulocytes: 0 %
Lymphocytes Relative: 23 %
Lymphs Abs: 1.7 10*3/uL (ref 0.7–4.0)
MCH: 30.5 pg (ref 26.0–34.0)
MCHC: 34 g/dL (ref 30.0–36.0)
MCV: 89.9 fL (ref 80.0–100.0)
Monocytes Absolute: 0.6 10*3/uL (ref 0.1–1.0)
Monocytes Relative: 8 %
Neutro Abs: 4.9 10*3/uL (ref 1.7–7.7)
Neutrophils Relative %: 68 %
Platelets: 221 10*3/uL (ref 150–400)
RBC: 4.75 MIL/uL (ref 3.87–5.11)
RDW: 13 % (ref 11.5–15.5)
WBC: 7.2 10*3/uL (ref 4.0–10.5)
nRBC: 0 % (ref 0.0–0.2)

## 2020-09-28 LAB — IRON AND TIBC
Iron: 89 ug/dL (ref 28–170)
Saturation Ratios: 31 % (ref 10.4–31.8)
TIBC: 290 ug/dL (ref 250–450)
UIBC: 201 ug/dL

## 2020-09-28 LAB — FERRITIN: Ferritin: 25 ng/mL (ref 11–307)

## 2020-10-02 ENCOUNTER — Telehealth: Payer: Medicare Other | Admitting: Oncology

## 2020-10-02 ENCOUNTER — Ambulatory Visit: Payer: Medicare Other

## 2020-10-03 ENCOUNTER — Other Ambulatory Visit: Payer: Self-pay

## 2020-10-03 ENCOUNTER — Encounter: Payer: Self-pay | Admitting: Oncology

## 2020-10-03 ENCOUNTER — Ambulatory Visit: Payer: Medicare Other

## 2020-10-03 ENCOUNTER — Inpatient Hospital Stay (HOSPITAL_BASED_OUTPATIENT_CLINIC_OR_DEPARTMENT_OTHER): Payer: Medicare Other | Admitting: Oncology

## 2020-10-03 DIAGNOSIS — K648 Other hemorrhoids: Secondary | ICD-10-CM

## 2020-10-03 DIAGNOSIS — K921 Melena: Secondary | ICD-10-CM

## 2020-10-03 DIAGNOSIS — D500 Iron deficiency anemia secondary to blood loss (chronic): Secondary | ICD-10-CM | POA: Diagnosis not present

## 2020-10-03 NOTE — Progress Notes (Signed)
[facility] HEMATOLOGY-ONCOLOGY TeleHEALTH VISIT PROGRESS NOTE  I connected with Susan Howard on 10/03/20 at  2:15 PM EDT by video enabled telemedicine visit and verified that I am speaking with the correct person using two identifiers. I discussed the limitations, risks, security and privacy concerns of performing an evaluation and management service by telemedicine and the availability of in-person appointments.. The patient expressed understanding and agreed to proceed.   Other persons participating in the visit and their role in the encounter:  None  Patient's location: Home  Provider's location: office Chief Complaint: IDA   INTERVAL HISTORY Susan Howard is a 50 y.o. female who has above history reviewed by me today presents for follow up visit for management of IDA Patient reports feeling well.  During the interval, patient has had a hemorrhoid banded She feels well.  No new complaints.  Patient has good energy level.  Review of Systems  Constitutional:  Negative for appetite change, chills, fatigue and fever.  HENT:   Negative for hearing loss and voice change.   Eyes:  Negative for eye problems.  Respiratory:  Negative for chest tightness and cough.   Cardiovascular:  Negative for chest pain.  Gastrointestinal:  Negative for abdominal distention, abdominal pain and blood in stool.  Endocrine: Negative for hot flashes.  Genitourinary:  Negative for difficulty urinating and frequency.   Musculoskeletal:  Negative for arthralgias.  Skin:  Negative for itching and rash.  Neurological:  Negative for extremity weakness.  Hematological:  Negative for adenopathy.  Psychiatric/Behavioral:  Negative for confusion.    Past Medical History:  Diagnosis Date   Family history of breast cancer    declined BRCA testing 2013   Iron deficiency anemia due to chronic blood loss 11/09/2018   MRSA (methicillin resistant Staphylococcus aureus)    Spina bifida ([place])    Status post implantation of  artificial urinary sphincter 2006   neurogenic bladder   UTI (urinary tract infection) 12/2016   Past Surgical History:  Procedure Laterality Date   BACK SURGERY     for spinal bifida   CESAREAN SECTION  [phone]   G4P4   COLONOSCOPY WITH PROPOFOL N/A 06/07/2020   Procedure: COLONOSCOPY WITH PROPOFOL;  Surgeon: Jonathon Bellows, MD;  Location: [facility] ENDOSCOPY;  Service: Gastroenterology;  Laterality: N/A;  C-19 TEST  AM ON 06/06/2020   FOOT SURGERY     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 Last revision 09/2014    Family History  Problem Relation Age of Onset   Breast cancer Mother 63   Hypertension Mother    Leukemia Father 61   Neural tube defect Son        spinal bifida   Breast cancer Maternal Grandmother 51   Hypertension Maternal Grandmother    Heart disease Maternal Grandfather    Heart attack Maternal Grandfather 73    Social History   Socioeconomic History   Marital status: Married    Spouse name: Not on file   Number of children: 4   Years of education: Bachelor's   Highest education level: Not on file  Occupational History   Occupation: RN  Tobacco Use   Smoking status: Never   Smokeless tobacco: Never  Vaping Use   Vaping Use: Never used  Substance and Sexual Activity   Alcohol use: No    Alcohol/week: 0.0 standard drinks   Drug use: No   Sexual activity: Yes    Partners: Male    Birth control/protection: Surgical    Comment: tubal ligation  Other Topics Concern   Not on file  Social History Narrative   Not on file   Social Determinants of Health   Financial Resource Strain: Low Risk    Difficulty of Paying Living Expenses: Not hard at all  Food Insecurity: No Food Insecurity   Worried About Charity fundraiser  in the Last Year: Never true   [place] in the Last Year: Never true  Transportation Needs: No Transportation Needs   Lack of Transportation (Medical): No   Lack of Transportation (Non-Medical): No  Physical Activity: Sufficiently Active   Days of Exercise per Week: 6 days   Minutes of Exercise per Session: 60 min  Stress: No Stress Concern Present   Feeling of Stress : Not at all  Social Connections: Moderately Integrated   Frequency of Communication with Friends and Family: More than three times a week   Frequency of Social Gatherings with Friends and Family: More than three times a week   Attends Religious Services: More than 4 times per year   Active Member of [employer] or Organizations: No   Attends Archivist Meetings: Never   Marital Status: Married  Human resources officer Violence: Not At Risk   Fear of Current or Ex-Partner: No   Emotionally Abused: No   Physically Abused: No   Sexually Abused: No    Current Outpatient Medications on File Prior to Visit  Medication Sig Dispense Refill   Multiple Vitamin (MULTIVITAMIN) capsule Take 1 capsule by mouth daily.     nitrofurantoin, macrocrystal-monohydrate, (MACROBID) 100 MG capsule Take 1 capsule (100 mg total) by mouth 2 (two) times daily. 14 capsule 0   No current facility-administered medications on file prior to visit.    Allergies  Allergen Reactions   Latex Other (See Comments) and Rash    Respiratory distress: patient allergic to ALL LATEX   Other Itching and Other (See Comments)    Allergen - (blue) chux pads Reaction - sneezing Allergen - (blue) chux pads Reaction - sneezing Allergen - chlorine Reaction - sneezing    Clindamycin/Lincomycin Rash   Septra [Sulfamethoxazole-Trimethoprim] Rash       Observations/Objective: There were no vitals filed for this visit.  There is no height or weight on file to calculate BMI.  Physical Exam Neurological:     Mental Status: She is alert.    CBC     Component Value Date/Time   WBC 7.2 09/28/2020 1335   RBC 4.75 09/28/2020 1335   HGB 14.5 09/28/2020 1335   HGB 6.8 (LL) 11/04/2018 1024   HCT 42.7 09/28/2020 1335   HCT 23.9 (L) 11/04/2018 1024   PLT 221 09/28/2020 1335   PLT 294 11/04/2018 1024   MCV 89.9 09/28/2020 1335   MCV 64 (L) 11/04/2018 1024   MCH 30.5 09/28/2020 1335   MCHC 34.0 09/28/2020 1335   RDW 13.0 09/28/2020 1335   RDW 18.1 (H) 11/04/2018 1024   LYMPHSABS 1.7 09/28/2020 1335   LYMPHSABS 1.4 11/04/2018 1024   MONOABS 0.6 09/28/2020 1335   EOSABS 0.0 09/28/2020 1335   EOSABS 0.0 11/04/2018 1024   BASOSABS 0.1 09/28/2020 1335   BASOSABS 0.1 11/04/2018 1024    CMP     Component Value Date/Time  NA 140 11/04/2018 1024   K 4.4 11/04/2018 1024   CL 105 11/04/2018 1024   CO2 21 11/04/2018 1024   GLUCOSE 89 11/04/2018 1024   BUN 11 11/04/2018 1024   CREATININE 0.82 11/04/2018 1024   CALCIUM 9.2 11/04/2018 1024   PROT 6.6 11/04/2018 1024   ALBUMIN 4.3 11/04/2018 1024   AST 18 11/04/2018 1024   ALT 11 11/04/2018 1024   ALKPHOS 45 11/04/2018 1024   BILITOT 0.2 11/04/2018 1024   GFRNONAA 85 11/04/2018 1024   GFRAA 98 11/04/2018 1024     Assessment and Plan: 1. Iron deficiency anemia due to chronic blood loss     Labs reviewed and discussed with patient. Both hemoglobin and iron store have been stable and normal.  No need for additional IV Venofer treatments at this point. Patient will be discharged from our clinic and I advised her to continue follow-up with primary care physician.  Recommend she gets CBC and iron TIBC ferritin done 6 months with PCP, and if normal, continue surveillance annually.  She agrees with the plan.   I discussed the assessment and treatment plan with the patient. The patient was provided an opportunity to ask questions and all were answered. The patient agreed with the plan and demonstrated an understanding of the instructions.  The patient was advised to call back or seek an  in-person evaluation if the symptoms worsen or if the condition fails to improve as anticipated.    Earlie Server, MD 10/03/2020 7:59 PM

## 2020-10-04 ENCOUNTER — Inpatient Hospital Stay: Payer: Medicare Other

## 2020-10-25 DIAGNOSIS — Z0000 Encounter for general adult medical examination without abnormal findings: Secondary | ICD-10-CM | POA: Diagnosis not present

## 2020-11-15 DIAGNOSIS — Z8616 Personal history of COVID-19: Secondary | ICD-10-CM | POA: Diagnosis not present

## 2020-11-15 DIAGNOSIS — R071 Chest pain on breathing: Secondary | ICD-10-CM | POA: Diagnosis not present

## 2020-12-16 ENCOUNTER — Ambulatory Visit
Admission: RE | Admit: 2020-12-16 | Discharge: 2020-12-16 | Disposition: A | Discharge status: Home / Self Care | Payer: Medicare Other | Source: Ambulatory Visit | Attending: Emergency Medicine | Admitting: Emergency Medicine

## 2020-12-16 ENCOUNTER — Other Ambulatory Visit: Payer: Self-pay

## 2020-12-16 VITALS — BP 153/94 | HR 64 | Temp 98.8°F | Resp 20

## 2020-12-16 DIAGNOSIS — R300 Dysuria: Secondary | ICD-10-CM | POA: Diagnosis not present

## 2020-12-16 LAB — POCT URINALYSIS DIP (MANUAL ENTRY)
Bilirubin, UA: NEGATIVE
Blood, UA: NEGATIVE
Glucose, UA: NEGATIVE mg/dL
Ketones, POC UA: NEGATIVE mg/dL
Leukocytes, UA: NEGATIVE
Nitrite, UA: POSITIVE — AB
Protein Ur, POC: NEGATIVE mg/dL
Spec Grav, UA: >=1.03 — AB (ref 1.010–1.025)
Urobilinogen, UA: 0.2 E.U./dL
pH, UA: 5 (ref 5.0–8.0)

## 2020-12-16 MED ORDER — CIPROFLOXACIN HCL 500 MG PO TABS: 500.0000 mg | ORAL_TABLET | Freq: Two times a day (BID) | ORAL | 0 refills | Status: AC

## 2020-12-16 NOTE — ED Provider Notes (Signed)
[name]    CSN: [id number] Arrival date & time: 12/16/20  1211      History   Chief Complaint Chief Complaint  Patient presents with   Dysuria    HPI Susan Howard is a 50 y.o. female.  Patient presents with dysuria and urinary frequency for 2 to 3 days.  She just completed a course of Macrobid for a UTI; [place]  She denies fever, chills, abdominal pain, vaginal discharge, pelvic pain, flank pain, or other symptoms.  Her medical history includes frequent UTIs, bladder stent, spina bifida.  The history is provided by the patient and medical records.   Past Medical History:  Diagnosis Date   Family history of breast cancer    declined BRCA testing 2013   Iron deficiency anemia due to chronic blood loss 11/09/2018   MRSA (methicillin resistant Staphylococcus aureus)    Spina bifida ([place])    Status post implantation of artificial urinary sphincter 2006   neurogenic bladder   UTI (urinary tract infection) 12/2016    Patient Active Problem List   Diagnosis Date Noted   Dysuria 03/29/2020   Acute cystitis with hematuria 09/22/2019   Menometrorrhagia 01/30/2019   Iron deficiency anemia due to chronic blood loss 11/09/2018   Bursitis of shoulder 11/08/2018   Family history of breast cancer 01/20/2017   History of methicillin resistant Staphylococcus aureus infection 12/06/2014   Spina bifida of lumbar region [facility]) 10/11/2014    Past Surgical History:  Procedure Laterality Date   BACK SURGERY     for spinal bifida   CESAREAN SECTION  [phone]   G4P4   COLONOSCOPY WITH PROPOFOL N/A 06/07/2020   Procedure: COLONOSCOPY WITH PROPOFOL;  Surgeon: Jonathon Bellows, MD;  Location: [facility] ENDOSCOPY;  Service: Gastroenterology;  Laterality: N/A;  C-19 TEST  AM ON 06/06/2020   FOOT SURGERY     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 Last revision 09/2014    OB History     Gravida  4   Para  4   Term  4   Preterm      AB      Living  4      SAB      IAB      Ectopic      Multiple      Live Births  4            Home Medications    Prior to Admission medications   Medication Sig Start Date End Date Taking? Authorizing Provider  ciprofloxacin (CIPRO) 500 MG tablet Take 1 tablet (500 mg total) by mouth 2 (two) times daily for 5 days. 12/16/20 12/21/20 Yes Sharion Balloon, NP  Multiple Vitamin (MULTIVITAMIN) capsule Take 1 capsule by mouth daily.    [provider]  nitrofurantoin, macrocrystal-monohydrate, (MACROBID) 100 MG capsule Take 1 capsule (100 mg total) by mouth 2 (two) times daily. 07/18/20   Orvan July, NP    Family History Family History  Problem Relation Age of Onset   Breast cancer Mother 58   Hypertension Mother    Leukemia Father 49   Neural tube defect Son        spinal  bifida   Breast cancer Maternal Grandmother 20   Hypertension Maternal Grandmother    Heart disease Maternal Grandfather    Heart attack Maternal Grandfather 55    Social History Social History   Tobacco Use   Smoking status: Never   Smokeless tobacco: Never  Vaping Use   Vaping Use: Never used  Substance Use Topics   Alcohol use: No    Alcohol/week: 0.0 standard drinks   Drug use: No     Allergies   Latex, Other, Clindamycin/lincomycin, and Septra [sulfamethoxazole-trimethoprim]   Review of Systems Review of Systems  Constitutional:  Negative for chills and fever.  Respiratory:  Negative for cough and shortness of breath.   Cardiovascular:  Negative for chest pain and palpitations.  Gastrointestinal:  Negative for abdominal pain and vomiting.  Genitourinary:  Positive for dysuria and frequency. Negative for flank pain, hematuria, pelvic  pain and vaginal discharge.  Skin:  Negative for color change and rash.  All other systems reviewed and are negative.   Physical Exam Triage Vital Signs ED Triage Vitals  Enc Vitals Group     BP      Pulse      Resp      Temp      Temp src      SpO2      Weight      Height      Head Circumference      Peak Flow      Pain Score      Pain Loc      Pain Edu?      Excl. in [place]?    No data found.  Updated Vital Signs BP (!) 153/94   Pulse 64   Temp 98.8 F (37.1 C) (Oral)   Resp 20   SpO2 98%   Visual Acuity Right Eye Distance:   Left Eye Distance:   Bilateral Distance:    Right Eye Near:   Left Eye Near:    Bilateral Near:     Physical Exam Vitals and nursing note reviewed.  Constitutional:      General: She is not in acute distress.    Appearance: She is well-developed. She is not ill-appearing.  HENT:     Head: Normocephalic and atraumatic.     Mouth/Throat:     Mouth: Mucous membranes are moist.  Eyes:     Conjunctiva/sclera: Conjunctivae normal.  Cardiovascular:     Rate and Rhythm: Normal rate and regular rhythm.     Heart sounds: Normal heart sounds.  Pulmonary:     Effort: Pulmonary effort is normal. No respiratory distress.     Breath sounds: Normal breath sounds.  Abdominal:     Palpations: Abdomen is soft.     Tenderness: There is no abdominal tenderness. There is no right CVA tenderness, left CVA tenderness, guarding or rebound.  Musculoskeletal:     Cervical back: Neck supple.  Skin:    General: Skin is warm and dry.  Neurological:     Mental Status: She is alert and oriented to person, place, and time.  Psychiatric:        Mood and Affect: Mood normal.        Behavior: Behavior normal.     UC Treatments / Results  Labs (all labs ordered are listed, but only abnormal results are displayed) Labs Reviewed  POCT URINALYSIS DIP (MANUAL ENTRY) - Abnormal; Notable for the following components:      Result Value   Spec Grav,  UA >=1.030  (*)    Nitrite, UA Positive (*)    All other components within normal limits  URINE CULTURE    EKG   Radiology No results found.  Procedures Procedures (including critical care time)  Medications Ordered in UC Medications - No data to display  Initial Impression / Assessment and Plan / UC Course  I have reviewed the triage vital signs and the nursing notes.  Pertinent labs & imaging results that were available during my care of the patient were reviewed by me and considered in my medical decision making (see chart for details).  Dysuria.  Urine positive for nitrite.  Urine culture pending.  Patient just completed a course of Macrobid.  Discussed treatment with Keflex; she states previous UTIs have been resistant to Keflex and that she has taken Cipro in the past with good results.  I discussed the black box warnings for Cipro, including the risk for tendon rupture; she states she is willing to accept the risk.  Treating today with Cipro x5 days.  Discussed with patient we will call her if the culture shows the need to change or discontinue the antibiotic.  Education provided on dysuria.  Instructed patient to follow-up with her PCP if her symptoms or not improving.  She agrees to plan of care.   Final Clinical Impressions(s) / UC Diagnoses   Final diagnoses:  Dysuria     Discharge Instructions      Take the antibiotic as directed.  The urine culture is pending.  We will call you if it shows the need to change or discontinue your antibiotic.    Follow up with your primary care provider if your symptoms are not improving.         ED Prescriptions     Medication Sig Dispense Auth. Provider   ciprofloxacin (CIPRO) 500 MG tablet Take 1 tablet (500 mg total) by mouth 2 (two) times daily for 5 days. 10 tablet Sharion Balloon, NP      PDMP not reviewed this encounter.   Sharion Balloon, NP 12/16/20 [phone]

## 2020-12-16 NOTE — ED Triage Notes (Addendum)
Pt here with burning while urinating and frequency. Pt had a UTI earlier this month for which she took macrobid for and finished around the 11th. Prone to infections due to stent in bladder from spina bifida tx.

## 2020-12-16 NOTE — Discharge Instructions (Addendum)
Take the antibiotic as directed.  The urine culture is pending.  We will call you if it shows the need to change or discontinue your antibiotic.    Follow up with your primary care provider if your symptoms are not improving.    

## 2020-12-18 LAB — URINE CULTURE: Culture: >=100000 — AB

## 2021-02-14 DIAGNOSIS — Z87440 Personal history of urinary (tract) infections: Secondary | ICD-10-CM | POA: Diagnosis not present

## 2021-02-14 DIAGNOSIS — Q057 Lumbar spina bifida without hydrocephalus: Secondary | ICD-10-CM | POA: Diagnosis not present

## 2021-02-14 DIAGNOSIS — R300 Dysuria: Secondary | ICD-10-CM | POA: Diagnosis not present

## 2021-03-20 ENCOUNTER — Ambulatory Visit: Payer: Medicare Other

## 2021-06-27 DIAGNOSIS — Z803 Family history of malignant neoplasm of breast: Secondary | ICD-10-CM | POA: Diagnosis not present

## 2021-06-27 DIAGNOSIS — Z8249 Family history of ischemic heart disease and other diseases of the circulatory system: Secondary | ICD-10-CM | POA: Diagnosis not present

## 2021-06-27 DIAGNOSIS — Q057 Lumbar spina bifida without hydrocephalus: Secondary | ICD-10-CM | POA: Diagnosis not present

## 2021-06-27 DIAGNOSIS — T83191A Other mechanical complication of urinary sphincter implant, initial encounter: Secondary | ICD-10-CM | POA: Diagnosis not present

## 2021-06-27 DIAGNOSIS — Z79899 Other long term (current) drug therapy: Secondary | ICD-10-CM | POA: Diagnosis not present

## 2021-07-31 DIAGNOSIS — Q057 Lumbar spina bifida without hydrocephalus: Secondary | ICD-10-CM | POA: Diagnosis not present

## 2021-07-31 DIAGNOSIS — R300 Dysuria: Secondary | ICD-10-CM | POA: Diagnosis not present

## 2021-07-31 DIAGNOSIS — R8290 Unspecified abnormal findings in urine: Secondary | ICD-10-CM | POA: Diagnosis not present

## 2021-07-31 DIAGNOSIS — Z87440 Personal history of urinary (tract) infections: Secondary | ICD-10-CM | POA: Diagnosis not present

## 2021-08-14 DIAGNOSIS — M47816 Spondylosis without myelopathy or radiculopathy, lumbar region: Secondary | ICD-10-CM | POA: Diagnosis not present

## 2021-09-20 DIAGNOSIS — J029 Acute pharyngitis, unspecified: Secondary | ICD-10-CM | POA: Diagnosis not present

## 2021-09-30 DIAGNOSIS — J329 Chronic sinusitis, unspecified: Secondary | ICD-10-CM | POA: Diagnosis not present

## 2021-11-14 DIAGNOSIS — N319 Neuromuscular dysfunction of bladder, unspecified: Secondary | ICD-10-CM | POA: Diagnosis not present

## 2021-11-14 DIAGNOSIS — N390 Urinary tract infection, site not specified: Secondary | ICD-10-CM | POA: Diagnosis not present

## 2021-12-24 ENCOUNTER — Encounter: Payer: Self-pay | Admitting: Emergency Medicine

## 2021-12-24 ENCOUNTER — Ambulatory Visit
Admission: EM | Admit: 2021-12-24 | Discharge: 2021-12-24 | Disposition: A | Discharge status: Home / Self Care | Payer: Medicare Other | Attending: Emergency Medicine | Admitting: Emergency Medicine

## 2021-12-24 DIAGNOSIS — R300 Dysuria: Secondary | ICD-10-CM | POA: Insufficient documentation

## 2021-12-24 LAB — POCT URINALYSIS DIP (MANUAL ENTRY)
Bilirubin, UA: NEGATIVE
Glucose, UA: NEGATIVE mg/dL
Ketones, POC UA: NEGATIVE mg/dL
Nitrite, UA: NEGATIVE
Protein Ur, POC: 100 mg/dL — AB
Spec Grav, UA: >=1.03 — AB (ref 1.010–1.025)
Urobilinogen, UA: 0.2 E.U./dL
pH, UA: 5.5 (ref 5.0–8.0)

## 2021-12-24 MED ORDER — NITROFURANTOIN MONOHYD MACRO 100 MG PO CAPS: 100.0000 mg | ORAL_CAPSULE | Freq: Two times a day (BID) | ORAL | 0 refills | Status: DC

## 2021-12-24 NOTE — ED Triage Notes (Signed)
Provider triage  

## 2021-12-24 NOTE — Discharge Instructions (Addendum)
Take the antibiotic as directed.  The urine culture is pending.  We will call you if it shows the need to change or discontinue your antibiotic.    Follow up with your primary care provider if your symptoms are not improving.    

## 2021-12-24 NOTE — ED Provider Notes (Signed)
[name]    CSN: [id number] Arrival date & time: 12/24/21  1848      History   Chief Complaint Chief Complaint  Patient presents with   Urinary Frequency    UTI - Entered by patient    HPI Susan Howard is a 51 y.o. female.  Patient presents with dysuria since yesterday.  She has history of recurrent UTIs.  She denies fever, chills, abdominal pain, flank pain, vaginal discharge, pelvic pain, or other symptoms.  No OTC treatments at home.  Her medical history includes neurogenic bladder, spina bifida, iron deficiency anemia.  The history is provided by the patient and medical records.    Past Medical History:  Diagnosis Date   Family history of breast cancer    declined BRCA testing 2013   Iron deficiency anemia due to chronic blood loss 11/09/2018   MRSA (methicillin resistant Staphylococcus aureus)    Spina bifida ([place])    Status post implantation of artificial urinary sphincter 2006   neurogenic bladder   UTI (urinary tract infection) 12/2016    Patient Active Problem List   Diagnosis Date Noted   Dysuria 03/29/2020   Acute cystitis with hematuria 09/22/2019   Menometrorrhagia 01/30/2019   Iron deficiency anemia due to chronic blood loss 11/09/2018   Bursitis of shoulder 11/08/2018   Family history of breast cancer 01/20/2017   History of methicillin resistant Staphylococcus aureus infection 12/06/2014   Spina bifida of lumbar region [facility]) 10/11/2014    Past Surgical History:  Procedure Laterality Date   BACK SURGERY     for spinal bifida   CESAREAN SECTION  [phone]   G4P4   COLONOSCOPY WITH PROPOFOL N/A 06/07/2020   Procedure: COLONOSCOPY WITH PROPOFOL;  Surgeon: Jonathon Bellows, MD;  Location: [facility] ENDOSCOPY;  Service: Gastroenterology;  Laterality: N/A;  C-19 TEST  AM ON 06/06/2020   FOOT 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 Last revision 09/2014    OB History     Gravida  4   Para  4   Term  4   Preterm      AB      Living  4      SAB      IAB      Ectopic      Multiple      Live Births  4            Home Medications    Prior to Admission medications   Medication Sig Start Date End Date Taking? Authorizing Provider  nitrofurantoin, macrocrystal-monohydrate, (MACROBID) 100 MG capsule Take 1 capsule (100 mg total) by mouth 2 (two) times daily. 12/24/21  Yes Sharion Balloon, NP  Multiple Vitamin (MULTIVITAMIN) capsule Take 1 capsule by mouth daily.    [provider]    Family History Family History  Problem Relation Age of Onset   Breast cancer Mother 3   Hypertension Mother    Leukemia Father 50   Neural tube defect Son        spinal bifida   Breast cancer Maternal Grandmother 48   Hypertension Maternal Grandmother    Heart disease Maternal Grandfather    Heart attack Maternal Grandfather 15  Social History Social History   Tobacco Use   Smoking status: Never   Smokeless tobacco: Never  Vaping Use   Vaping Use: Never used  Substance Use Topics   Alcohol use: No    Alcohol/week: 0.0 standard drinks of alcohol   Drug use: No     Allergies   Latex, Other, Clindamycin/lincomycin, and Septra [sulfamethoxazole-trimethoprim]   Review of Systems Review of Systems  Constitutional:  Negative for chills and fever.  Gastrointestinal:  Negative for abdominal pain, nausea and vomiting.  Genitourinary:  Positive for dysuria. Negative for flank pain, hematuria, pelvic pain and vaginal discharge.  Skin:  Negative for color change and rash.  All other systems reviewed and are negative.    Physical Exam Triage Vital Signs ED Triage Vitals  Enc Vitals Group     BP      Pulse      Resp      Temp      Temp src      SpO2       Weight      Height      Head Circumference      Peak Flow      Pain Score      Pain Loc      Pain Edu?      Excl. in [place]?    No data found.  Updated Vital Signs BP (!) 156/101   Pulse 84   Temp 98.2 F (36.8 C)   Resp 18   SpO2 97%   Visual Acuity Right Eye Distance:   Left Eye Distance:   Bilateral Distance:    Right Eye Near:   Left Eye Near:    Bilateral Near:     Physical Exam Vitals and nursing note reviewed.  Constitutional:      General: She is not in acute distress.    Appearance: Normal appearance. She is well-developed. She is not ill-appearing.  HENT:     Mouth/Throat:     Mouth: Mucous membranes are moist.  Cardiovascular:     Rate and Rhythm: Normal rate and regular rhythm.     Heart sounds: Normal heart sounds.  Pulmonary:     Effort: Pulmonary effort is normal. No respiratory distress.     Breath sounds: Normal breath sounds.  Abdominal:     Palpations: Abdomen is soft.     Tenderness: There is no abdominal tenderness. There is no right CVA tenderness, left CVA tenderness, guarding or rebound.  Musculoskeletal:     Cervical back: Neck supple.  Skin:    General: Skin is warm and dry.  Neurological:     Mental Status: She is alert.  Psychiatric:        Mood and Affect: Mood normal.        Behavior: Behavior normal.      UC Treatments / Results  Labs (all labs ordered are listed, but only abnormal results are displayed) Labs Reviewed  POCT URINALYSIS DIP (MANUAL ENTRY) - Abnormal; Notable for the following components:      Result Value   Clarity, UA cloudy (*)    Spec Grav, UA >=1.030 (*)    Blood, UA small (*)    Protein Ur, POC =100 (*)    Leukocytes, UA Large (3+) (*)    All other components within normal limits  URINE CULTURE    EKG   Radiology No results found.  Procedures Procedures (including critical care time)  Medications Ordered in UC Medications - No data  to display  Initial Impression / Assessment and  Plan / UC Course  I have reviewed the triage vital signs and the nursing notes.  Pertinent labs & imaging results that were available during my care of the patient were reviewed by me and considered in my medical decision making (see chart for details).   Dysuria.  Patient has history of neurogenic bladder and frequent UTIs.  Treating with Macrobid. Urine culture pending. Discussed with patient that we will call her if the urine culture shows the need to change or discontinue the antibiotic. Instructed her to follow-up with her PCP if her symptoms are not improving. Patient agrees to plan of care.      Final Clinical Impressions(s) / UC Diagnoses   Final diagnoses:  Dysuria     Discharge Instructions      Take the antibiotic as directed.  The urine culture is pending.  We will call you if it shows the need to change or discontinue your antibiotic.    Follow up with your primary care provider if your symptoms are not improving.        ED Prescriptions     Medication Sig Dispense Auth. Provider   nitrofurantoin, macrocrystal-monohydrate, (MACROBID) 100 MG capsule Take 1 capsule (100 mg total) by mouth 2 (two) times daily. 10 capsule Sharion Balloon, NP      PDMP not reviewed this encounter.   Sharion Balloon, NP 12/24/21 [phone]

## 2021-12-26 DIAGNOSIS — N390 Urinary tract infection, site not specified: Secondary | ICD-10-CM | POA: Diagnosis not present

## 2021-12-26 LAB — URINE CULTURE: Culture: >=100000 — AB

## 2023-02-01 ENCOUNTER — Ambulatory Visit
Admission: EM | Admit: 2023-02-01 | Discharge: 2023-02-01 | Disposition: A | Discharge status: Home / Self Care | Payer: Medicare HMO | Attending: Emergency Medicine | Admitting: Emergency Medicine

## 2023-02-01 ENCOUNTER — Encounter: Payer: Self-pay | Admitting: Oncology

## 2023-02-01 ENCOUNTER — Encounter: Payer: Self-pay | Admitting: *Deleted

## 2023-02-01 DIAGNOSIS — R300 Dysuria: Secondary | ICD-10-CM | POA: Insufficient documentation

## 2023-02-01 DIAGNOSIS — M546 Pain in thoracic spine: Secondary | ICD-10-CM | POA: Diagnosis present

## 2023-02-01 DIAGNOSIS — N3000 Acute cystitis without hematuria: Secondary | ICD-10-CM | POA: Insufficient documentation

## 2023-02-01 LAB — POCT URINALYSIS DIP (MANUAL ENTRY)
Bilirubin, UA: NEGATIVE
Glucose, UA: 100 mg/dL — AB
Nitrite, UA: POSITIVE — AB
Protein Ur, POC: 100 mg/dL — AB
Spec Grav, UA: 1.025 (ref 1.010–1.025)
Urobilinogen, UA: 1 U/dL
pH, UA: 5 (ref 5.0–8.0)

## 2023-02-01 MED ORDER — PREDNISONE 20 MG PO TABS: 40.0000 mg | ORAL_TABLET | Freq: Every day | ORAL | 0 refills | Status: DC

## 2023-02-01 MED ORDER — NITROFURANTOIN MONOHYD MACRO 100 MG PO CAPS: 100.0000 mg | ORAL_CAPSULE | Freq: Two times a day (BID) | ORAL | 0 refills | Status: AC

## 2023-02-01 NOTE — ED Provider Notes (Signed)
Renaldo Fiddler    CSN: [id number] Arrival date & time: 02/01/23  0802      History   Chief Complaint Chief Complaint  Patient presents with   Dysuria   Muscle Pain    HPI Susan Howard is a 52 y.o. female.   Patient presents for evaluation of dysuria and urinary frequency present for 2 days.  Possible lower abdominal pressure by as she is to be experiencing right sided back pain she is unsure of which one is the cause.  Has attempted use of Azo which has been somewhat helpful.  History of reoccurring UTI.  Denies hematuria, flank pain, fever, vaginal symptoms.  Has been experiencing right sided back pain for 1-1/2 weeks since lifting and moving heavy boxes.  Pain radiates to the right upper abdomen.  Denies numbness or tingling.  Pain exacerbated with twisting turning bending.  Has attempted use of ibuprofen which has been somewhat helpful but has not resolved.  Urinary or bowel incontinence.   Past Medical History:  Diagnosis Date   Family history of breast cancer    declined BRCA testing 2013   Iron deficiency anemia due to chronic blood loss 11/09/2018   MRSA (methicillin resistant Staphylococcus aureus)    Spina bifida ([facility])    Status post implantation of artificial urinary sphincter 2006   neurogenic bladder   UTI (urinary tract infection) 12/2016    Patient Active Problem List   Diagnosis Date Noted   Dysuria 03/29/2020   Acute cystitis with hematuria 09/22/2019   Menometrorrhagia 01/30/2019   Iron deficiency anemia due to chronic blood loss 11/09/2018   Bursitis of shoulder 11/08/2018   Family history of breast cancer 01/20/2017   History of methicillin resistant Staphylococcus aureus infection 12/06/2014   Spina bifida of lumbar region [facility]) 10/11/2014    Past Surgical History:  Procedure Laterality Date   BACK SURGERY     for spinal bifida   CESAREAN SECTION  [phone]   G4P4   COLONOSCOPY WITH PROPOFOL N/A 06/07/2020   Procedure:  COLONOSCOPY WITH PROPOFOL;  Surgeon: Wyline Mood, MD;  Location: [facility] ENDOSCOPY;  Service: Gastroenterology;  Laterality: N/A;  C-19 TEST  AM ON 06/06/2020   FOOT SURGERY     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 Last revision 09/2014    OB History     Gravida  4   Para  4   Term  4   Preterm      AB      Living  4      SAB      IAB      Ectopic      Multiple      Live Births  4            Home Medications    Prior to Admission medications   Medication Sig Start Date End Date Taking? Authorizing Provider  nitrofurantoin, macrocrystal-monohydrate, (MACROBID) 100 MG capsule Take 1 capsule (100 mg total) by mouth 2 (two) times daily. 02/01/23  Yes Dalissa Lovin R, NP  predniSONE (DELTASONE) 20 MG tablet Take 2 tablets (40 mg total) by mouth daily. 02/01/23  Yes Afifa Truax, Elita Boone, NP  Multiple Vitamin (MULTIVITAMIN) capsule Take 1 capsule by mouth daily.  [provider]    Family History Family History  Problem Relation Age of Onset   Breast cancer Mother 42   Hypertension Mother    Leukemia Father 33   Neural tube defect Son        spinal bifida   Breast cancer Maternal Grandmother 40   Hypertension Maternal Grandmother    Heart disease Maternal Grandfather    Heart attack Maternal Grandfather 79    Social History Social History   Tobacco Use   Smoking status: Never   Smokeless tobacco: Never  Vaping Use   Vaping status: Never Used  Substance Use Topics   Alcohol use: No    Alcohol/week: 0.0 standard drinks of alcohol   Drug use: No     Allergies   Latex, Other, Clindamycin/lincomycin, and Septra [sulfamethoxazole-trimethoprim]   Review of Systems Review of Systems   Physical Exam Triage  Vital Signs ED Triage Vitals  Encounter Vitals Group     BP 02/01/23 0814 (!) 137/92     Systolic BP Percentile --      Diastolic BP Percentile --      Pulse Rate 02/01/23 0814 73     Resp 02/01/23 0814 16     Temp 02/01/23 0814 97.8 F (36.6 C)     Temp Source 02/01/23 0814 Oral     SpO2 --      Weight 02/01/23 0812 106 lb (48.1 kg)     Height 02/01/23 0812 5\' 1"  (1.549 m)     Head Circumference --      Peak Flow --      Pain Score 02/01/23 0811 5     Pain Loc --      Pain Education --      Exclude from Growth Chart --    No data found.  Updated Vital Signs BP (!) 137/92 (BP Location: Left Arm)   Pulse 73   Temp 97.8 F (36.6 C) (Oral)   Resp 16   Ht 5\' 1"  (1.549 m)   Wt 106 lb (48.1 kg)   BMI 20.03 kg/m   Visual Acuity Right Eye Distance:   Left Eye Distance:   Bilateral Distance:    Right Eye Near:   Left Eye Near:    Bilateral Near:     Physical Exam Constitutional:      Appearance: Normal appearance.  Eyes:     Extraocular Movements: Extraocular movements intact.  Abdominal:     General: Abdomen is flat. Bowel sounds are normal.     Palpations: Abdomen is soft.     Tenderness: There is abdominal tenderness in the suprapubic area.  Musculoskeletal:     Comments: Tenderness present to the right thoracic region without spinal tenderness noted, able to twist turn and bend but pain is elicited with all movements, negative straight leg test bilaterally  Neurological:     Mental Status: She is alert and oriented to person, place, and time. Mental status is at baseline.      UC Treatments / Results  Labs (all labs ordered are listed, but only abnormal results are displayed) Labs Reviewed  POCT URINALYSIS DIP (MANUAL ENTRY) - Abnormal; Notable for the following components:      Result Value   Color, UA orange (*)    Clarity, UA cloudy (*)    Glucose, UA =100 (*)    Ketones, POC UA trace (5) (*)    Blood, UA large (*)    Protein Ur, POC =100 (*)  Nitrite, UA Positive (*)    Leukocytes, UA Large (3+) (*)    All other components within normal limits  URINE CULTURE    EKG   Radiology No results found.  Procedures Procedures (including critical care time)  Medications Ordered in UC Medications - No data to display  Initial Impression / Assessment and Plan / UC Course  I have reviewed the triage vital signs and the nursing notes.  Pertinent labs & imaging results that were available during my care of the patient were reviewed by me and considered in my medical decision making (see chart for details).  Acute cystitis without hematuria, acute right-sided thoracic back pain  Urinalysis showing nitrates and leukocytes, sent for culture , antibiotic chosen from last urine culture available on 12/24/2021, Macrobid sent to pharmacy, has used in the past with success, recommended additional supportive care, may continue use of Azo, advised follow-up if symptoms continue to persist worsen or recur  Etiology of back pain is most likely muscular, has been using NSAIDs which while helpful has not resolved symptoms, prescribed prednisone, declined prescription for muscle relaxant, recommended RICE, heat massage stretching and activity as tolerated, walking referral given to orthopedics, may also follow-up with urgent care as needed Final Clinical Impressions(s) / UC Diagnoses   Final diagnoses:  Acute cystitis without hematuria  Acute right-sided thoracic back pain     Discharge Instructions      Your urinalysis shows Pietro Bonura blood cells and nitrates which are indicative of infection, your urine will be sent to the lab to determine exactly which bacteria is present, if any changes need to be made to your medications you will be notified  Begin use of your bed every morning and every evening for 5 days, this antibiotic was based on the last urine culture available  You may use over-the-counter Azo to help minimize your symptoms until  antibiotic removes bacteria, this medication will turn your urine orange  Increase your fluid intake through use of water  As always practice good hygiene, wiping front to back and avoidance of scented vaginal products to prevent further irritation  If symptoms continue to persist after use of medication or recur please follow-up with urgent care or your primary doctor as needed   Your pain is most likely caused by irritation to the muscles.  Exam pain is directly over the big back muscle which is the latissimus dorsi  Given prednisone every morning with food for 5 days, helps to reduce inflammation and helps with pain, may use Tylenol 500 mg to 1000 mg every 6 hours in addition to this as needed  You may use heating pad in 15 minute intervals as needed for additional comfort, or you may find comfort in using ice in 10-15 minutes over affected area  Begin touching or stretching affected area daily for 10 minutes as tolerated to further loosen muscles   When sitting or lying down place pillow underneath and between knees for support  Can try sleeping without pillow on firm mattress   Practice good posture: head back, shoulders back, chest forward, pelvis back and weight distributed evenly on both legs  If pain persist after recommended treatment or reoccurs if may be beneficial to follow up with orthopedic specialist for evaluation, this doctor specializes in the bones and can manage your symptoms long-term with options such as but not limited to imaging, medications or physical therapy       ED Prescriptions     Medication Sig Dispense Auth.  Provider   predniSONE (DELTASONE) 20 MG tablet Take 2 tablets (40 mg total) by mouth daily. 10 tablet Sharryn Belding R, NP   nitrofurantoin, macrocrystal-monohydrate, (MACROBID) 100 MG capsule Take 1 capsule (100 mg total) by mouth 2 (two) times daily. 10 capsule Valinda Hoar, NP      PDMP not reviewed this encounter.   Valinda Hoar, NP 02/01/23 0941    Valinda Hoar, NP 02/04/23 [phone]

## 2023-02-01 NOTE — Discharge Instructions (Signed)
Your urinalysis shows Susan Howard blood cells and nitrates which are indicative of infection, your urine will be sent to the lab to determine exactly which bacteria is present, if any changes need to be made to your medications you will be notified  Begin use of your bed every morning and every evening for 5 days, this antibiotic was based on the last urine culture available  You may use over-the-counter Azo to help minimize your symptoms until antibiotic removes bacteria, this medication will turn your urine orange  Increase your fluid intake through use of water  As always practice good hygiene, wiping front to back and avoidance of scented vaginal products to prevent further irritation  If symptoms continue to persist after use of medication or recur please follow-up with urgent care or your primary doctor as needed   Your pain is most likely caused by irritation to the muscles.  Exam pain is directly over the big back muscle which is the latissimus dorsi  Given prednisone every morning with food for 5 days, helps to reduce inflammation and helps with pain, may use Tylenol 500 mg to 1000 mg every 6 hours in addition to this as needed  You may use heating pad in 15 minute intervals as needed for additional comfort, or you may find comfort in using ice in 10-15 minutes over affected area  Begin touching or stretching affected area daily for 10 minutes as tolerated to further loosen muscles   When sitting or lying down place pillow underneath and between knees for support  Can try sleeping without pillow on firm mattress   Practice good posture: head back, shoulders back, chest forward, pelvis back and weight distributed evenly on both legs  If pain persist after recommended treatment or reoccurs if may be beneficial to follow up with orthopedic specialist for evaluation, this doctor specializes in the bones and can manage your symptoms long-term with options such as but not limited to imaging,  medications or physical therapy

## 2023-02-01 NOTE — ED Triage Notes (Signed)
Patient states dysuria and frequency since Friday, frequent UTIs, right side upper back muscle strain for over a week.  Took Azo last night

## 2023-02-04 LAB — URINE CULTURE: Culture: >=100000 — AB

## 2023-11-22 ENCOUNTER — Ambulatory Visit
Admission: EM | Admit: 2023-11-22 | Discharge: 2023-11-22 | Disposition: A | Discharge status: Home / Self Care | Attending: Emergency Medicine | Admitting: Emergency Medicine

## 2023-11-22 DIAGNOSIS — R109 Unspecified abdominal pain: Secondary | ICD-10-CM | POA: Diagnosis not present

## 2023-11-22 DIAGNOSIS — H938X2 Other specified disorders of left ear: Secondary | ICD-10-CM

## 2023-11-22 MED ORDER — PREDNISONE 10 MG (21) PO TBPK: ORAL_TABLET | Freq: Every day | ORAL | 0 refills | Status: DC

## 2023-11-22 MED ORDER — IPRATROPIUM BROMIDE 0.03 % NA SOLN: 2.0000 | Freq: Two times a day (BID) | NASAL | 0 refills | Status: AC
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
## 2023-11-22 NOTE — ED Triage Notes (Signed)
 Patient states that she's having left side pain. Patient states that she's had the pain off and of since fall of last year. She pulled a muscle pulling waters from a truck.   Left ear fullness since Friday.

## 2023-11-22 NOTE — Discharge Instructions (Addendum)
 Today you are evaluated for your side pain which is most likely muscular however due to timeline that it is occurring you would most likely benefit for imaging to ensure nothing is being missed unfortunately we do not have this available today, please follow-up with your primary doctor or orthopedic specialist for reevaluation, information from page  On exam there is no abnormality to the ear indicating infection and is most likely coming from eustachian tube and sinuses  Begin prednisone  every morning with food to help reduce side pain as well as pressure within the ear, avoid use of ibuprofen during treatment but she may take Tylenol   Continue use of Sudafed or similar product such as Mucinex, allergy medicine etc. to reduce any further sinus congestion  You may use nasal spray twice daily as needed to help clear out the sinuses station tube and ideally reduce ear fullness  You may hold heat or ice over the your left side in 10 to 15-minute intervals  Cushion the body with pillows whenever sitting and lying  You may massage and stretch the area if tolerable
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
## 2023-12-14 ENCOUNTER — Emergency Department
Admission: EM | Admit: 2023-12-14 | Discharge: 2023-12-14 | Disposition: A | Discharge status: Home / Self Care | Attending: Emergency Medicine | Admitting: Emergency Medicine

## 2023-12-14 ENCOUNTER — Emergency Department

## 2023-12-14 ENCOUNTER — Other Ambulatory Visit: Payer: Self-pay

## 2023-12-14 ENCOUNTER — Encounter: Payer: Self-pay | Admitting: Emergency Medicine

## 2023-12-14 DIAGNOSIS — M542 Cervicalgia: Secondary | ICD-10-CM | POA: Diagnosis not present

## 2023-12-14 DIAGNOSIS — R519 Headache, unspecified: Secondary | ICD-10-CM | POA: Diagnosis present

## 2023-12-14 LAB — BASIC METABOLIC PANEL WITH GFR
Anion gap: 8 (ref 5–15)
BUN: 12 mg/dL (ref 6–20)
CO2: 25 mmol/L (ref 22–32)
Calcium: 9.3 mg/dL (ref 8.9–10.3)
Chloride: 108 mmol/L (ref 98–111)
Creatinine, Ser: 0.65 mg/dL (ref 0.44–1.00)
GFR, Estimated: >60 mL/min (ref >60)
Glucose, Bld: 115 mg/dL — ABNORMAL HIGH (ref 70–99)
Potassium: 3.9 mmol/L (ref 3.5–5.1)
Sodium: 141 mmol/L (ref 135–145)

## 2023-12-14 LAB — CBC
HCT: 41.7 % (ref 36.0–46.0)
Hemoglobin: 13.9 g/dL (ref 12.0–15.0)
MCH: 30.3 pg (ref 26.0–34.0)
MCHC: 33.3 g/dL (ref 30.0–36.0)
MCV: 90.8 fL (ref 80.0–100.0)
Platelets: 235 K/uL (ref 150–400)
RBC: 4.59 MIL/uL (ref 3.87–5.11)
RDW: 13.2 % (ref 11.5–15.5)
WBC: 4.6 K/uL (ref 4.0–10.5)
nRBC: 0 % (ref 0.0–0.2)

## 2023-12-14 MED ORDER — SODIUM CHLORIDE 0.9 % IV BOLUS
500.0000 mL | Freq: Once | INTRAVENOUS | Status: AC
  Administered 2023-12-14: 500 mL via INTRAVENOUS

## 2023-12-14 MED ORDER — DEXAMETHASONE SODIUM PHOSPHATE 10 MG/ML IJ SOLN: 10.0000 mg | Freq: Once | INTRAMUSCULAR | Status: DC

## 2023-12-14 MED ORDER — PROCHLORPERAZINE EDISYLATE 10 MG/2ML IJ SOLN
10.0000 mg | Freq: Once | INTRAMUSCULAR | Status: AC
  Administered 2023-12-14: 10 mg via INTRAVENOUS
  Filled 2023-12-14: qty 2

## 2023-12-14 MED ORDER — IOHEXOL 350 MG/ML SOLN
75.0000 mL | Freq: Once | INTRAVENOUS | Status: AC | PRN
  Administered 2023-12-14: 75 mL via INTRAVENOUS

## 2023-12-14 MED ORDER — KETOROLAC TROMETHAMINE 15 MG/ML IJ SOLN
15.0000 mg | Freq: Once | INTRAMUSCULAR | Status: AC
  Administered 2023-12-14: 15 mg via INTRAVENOUS
  Filled 2023-12-14: qty 1

## 2023-12-14 NOTE — ED Provider Notes (Signed)
 [facility] Provider Note    Event Date/Time   First MD Initiated Contact with Patient 12/14/23 [id number]     (approximate)   History   Headache   HPI  Susan Howard is a 53 y.o. female past medical history significant for acid reflux who presents to the emergency department with a headache.  States that she was running on Saturday and had a sudden onset of left-sided headache and neck pain.  States it has been a constant ongoing headache since that time.  Currently rates as a 5/10.  States that it was an ongoing pain that has not escalated.  No change in vision, slurring of speech, extremity numbness or weakness.  States that she has been having left hearing issues since she went to the beach approximately 3 weeks ago and did 2 rounds of steroids and just saw ENT.  States that her hearing has since improved.  Also followed by gastroenterology for some difficulty with swallowing and is planned to have an EGD.  When evaluated with cardiology prior to EGD for clearance.  Has been taking ibuprofen with some improvement of her headache intermittently.  No history of DVT or PE.  Not on any estrogen replacement.  No falls or head trauma.  No fever or altered mental status.  No new medications other than the steroids.  No change in vision.  Denies regular headaches.     Physical Exam   Triage Vital Signs: ED Triage Vitals  Encounter Vitals Group     BP 12/14/23 0633 (!) 150/97     Girls Systolic BP Percentile --      Girls Diastolic BP Percentile --      Boys Systolic BP Percentile --      Boys Diastolic BP Percentile --      Pulse Rate 12/14/23 0633 (!) 56     Resp 12/14/23 0633 16     Temp 12/14/23 0633 97.9 F (36.6 C)     Temp Source 12/14/23 0633 Oral     SpO2 12/14/23 0633 100 %     Weight 12/14/23 0632 103 lb 8 oz (46.9 kg)     Height 12/14/23 0632 5' 1 (1.549 m)     Head Circumference --      Peak Flow --      Pain Score 12/14/23 0632 5     Pain Loc --       Pain Education --      Exclude from Growth Chart --     Most recent vital signs: Vitals:   12/14/23 0633 12/14/23 1042  BP: (!) 150/97 (!) 140/88  Pulse: (!) 56 68  Resp: 16 16  Temp: 97.9 F (36.6 C) 98 F (36.7 C)  SpO2: 100% 100%    Physical Exam Constitutional:      Appearance: She is well-developed.  HENT:     Head: Atraumatic.  Eyes:     Extraocular Movements: Extraocular movements intact.     Conjunctiva/sclera: Conjunctivae normal.     Pupils: Pupils are equal, round, and reactive to light.  Cardiovascular:     Rate and Rhythm: Regular rhythm.  Pulmonary:     Effort: No respiratory distress.  Abdominal:     General: There is no distension.  Musculoskeletal:        General: Normal range of motion.     Cervical back: Full passive range of motion without pain and normal range of motion.  Skin:    General: Skin  is warm.  Neurological:     Mental Status: She is alert. Mental status is at baseline.     GCS: GCS eye subscore is 4. GCS verbal subscore is 5. GCS motor subscore is 6.     Cranial Nerves: Cranial nerves 2-12 are intact.     Sensory: Sensation is intact.     Motor: Motor function is intact.     Coordination: Coordination is intact.     Gait: Gait is intact.      IMPRESSION / MDM / ASSESSMENT AND PLAN / ED COURSE  I reviewed the triage vital signs and the nursing notes.  Broad differential diagnosis including primary headache, did have a sudden onset, considering subarachnoid hemorrhage, outside of the window to rule out with just a CT scan Noncon of her head.  Considered cerebral venous thrombosis.  Have a lower suspicion for dissection given that she has no neurologic deficits, no trauma no vertiginous symptoms.  No recurrent thunderclap headaches, have a lower suspicion for RCVS.  No focal neurologic exam deficits, have a lower suspicion for a malignancy.  Considered a demyelinating disorder given change in hearing, trouble with her swallowing  and other vague neurologic complaints.  Plan to start with CT scan of the head and give IV pain medication to treat her headache.  If headache does not improve and CT scan negative will proceed to CTA/CTV and reevaluate.  Low suspicion for meningitis, no meningismus on exam.   RADIOLOGY I independently reviewed imaging, my interpretation of imaging: CT scan Noncon of the head -no signs of subarachnoid hemorrhage.  No obvious malignancy.  Read as no acute findings.  CTA head and neck read as no acute findings  CTV read as no acute findings   Labs (all labs ordered are listed, but only abnormal results are displayed) Labs interpreted as -    Labs Reviewed  BASIC METABOLIC PANEL WITH GFR - Abnormal; Notable for the following components:      Result Value   Glucose, Bld 115 (*)    All other components within normal limits  CBC     Clinical Course as of 12/14/23 1100  Mon Dec 14, 2023  9142 States she is feeling better with some improvement of her headache.  Continues to have an ongoing headache but has improved.  Will order a CTA CTV to further evaluate for subarachnoid hemorrhage, dissection, venous thrombosis and other acute etiologies of her headache [SM]    Clinical Course User Index [SM] Suzanne Kirsch, MD   CTV with no signs of venous thrombosis.  CTA with no signs of subarachnoid hemorrhage or aneurysm or dissection.  On reevaluation improvement of her pain.  Given information to follow-up as an outpatient with neurology and her primary care provider.  Discussed symptomatic treatment and return precautions.  No questions at time of discharge.  PROCEDURES:  Critical Care performed: No  Procedures  Patient's presentation is most consistent with acute presentation with potential threat to life or bodily function.   MEDICATIONS ORDERED IN ED: Medications  ketorolac  (TORADOL ) 15 MG/ML injection 15 mg (15 mg Intravenous Given 12/14/23 0749)  sodium chloride  0.9 % bolus 500  mL (0 mLs Intravenous Stopped 12/14/23 0948)  prochlorperazine  (COMPAZINE ) injection 10 mg (10 mg Intravenous Given 12/14/23 0750)  iohexol  (OMNIPAQUE ) 350 MG/ML injection 75 mL (75 mLs Intravenous Contrast Given 12/14/23 1015)    FINAL CLINICAL IMPRESSION(S) / ED DIAGNOSES   Final diagnoses:  Headache disorder     Rx / DC  Orders   ED Discharge Orders     None        Note:  This document was prepared using Dragon voice recognition software and may include unintentional dictation errors.   Suzanne Kirsch, MD 12/14/23 1100

## 2023-12-14 NOTE — ED Triage Notes (Addendum)
 Patient presents with left side headache that started when running on Saturday. She describes the pain as sharp. Reports that she had hearing loss on left side 3 weeks ago, seen by ENT, and put on steroids until this past Thursday - she is unsure if this is related to the headache. Taking ibuprofen at home with some relief. Denies vision changes or weakness.

## 2023-12-14 NOTE — Discharge Instructions (Addendum)
 You are seen in the emergency department for a headache.  You had a CT scan done of your head that did not show any abnormalities.  You had a CTA and CT venogram that did not show any abnormalities to the blood vessels to your head and neck.  Did not show any blood clots.  You are given IV headache medication with some improvement of your headache.  It is importantly follow-up closely with your primary care provider.  Alternate Motrin and Tylenol  for headache control.  You are given information to follow-up with neurology.  Return to the emergency department if you have any worsening symptoms.  Stay hydrated and drink plenty of fluids.  Pain control:  Ibuprofen (motrin/aleve/advil) - You can take 3 tablets (600 mg) every 6 hours as needed for pain/fever.  Acetaminophen  (tylenol ) - You can take 2 extra strength tablets (1000 mg) every 6 hours as needed for pain/fever.  You can alternate these medications or take them together.  Make sure you eat food/drink water when taking these medications.

## 2023-12-14 NOTE — ED Notes (Signed)
 See triage note  Presents with headache   States she developed pain to left side of headache after she was in a run on Saturday. Denies any fever,n/v or vision changes

## 2023-12-23 ENCOUNTER — Encounter: Payer: Self-pay | Admitting: Gastroenterology

## 2023-12-30 ENCOUNTER — Ambulatory Visit
Admission: RE | Admit: 2023-12-30 | Discharge: 2023-12-30 | Disposition: A | Discharge status: Home / Self Care | Attending: Gastroenterology | Admitting: Gastroenterology

## 2023-12-30 ENCOUNTER — Ambulatory Visit: Admitting: General Practice

## 2023-12-30 ENCOUNTER — Encounter
Admission: RE | Disposition: A | Discharge status: Home / Self Care | Payer: Self-pay | Source: Home / Self Care | Attending: Gastroenterology

## 2023-12-30 ENCOUNTER — Other Ambulatory Visit: Payer: Self-pay

## 2023-12-30 ENCOUNTER — Encounter: Payer: Self-pay | Admitting: Gastroenterology

## 2023-12-30 DIAGNOSIS — K222 Esophageal obstruction: Secondary | ICD-10-CM | POA: Diagnosis not present

## 2023-12-30 DIAGNOSIS — K229 Disease of esophagus, unspecified: Secondary | ICD-10-CM | POA: Insufficient documentation

## 2023-12-30 DIAGNOSIS — K219 Gastro-esophageal reflux disease without esophagitis: Secondary | ICD-10-CM | POA: Insufficient documentation

## 2023-12-30 DIAGNOSIS — Q059 Spina bifida, unspecified: Secondary | ICD-10-CM | POA: Diagnosis not present

## 2023-12-30 DIAGNOSIS — R1310 Dysphagia, unspecified: Secondary | ICD-10-CM | POA: Insufficient documentation

## 2023-12-30 DIAGNOSIS — Z8614 Personal history of Methicillin resistant Staphylococcus aureus infection: Secondary | ICD-10-CM | POA: Diagnosis not present

## 2023-12-30 HISTORY — PX: ESOPHAGOGASTRODUODENOSCOPY: SHX5428

## 2023-12-30 LAB — POCT PREGNANCY, URINE: Preg Test, Ur: NEGATIVE

## 2023-12-30 SURGERY — EGD (ESOPHAGOGASTRODUODENOSCOPY)
Anesthesia: General

## 2023-12-30 MED ORDER — PROPOFOL 10 MG/ML IV BOLUS
INTRAVENOUS | Status: DC | PRN
  Administered 2023-12-30 (×2): 40 mg via INTRAVENOUS
  Administered 2023-12-30: 20 mg via INTRAVENOUS
  Administered 2023-12-30: 100 mg via INTRAVENOUS

## 2023-12-30 MED ORDER — SODIUM CHLORIDE 0.9 % IV SOLN: INTRAVENOUS | Status: DC

## 2023-12-30 MED ORDER — LIDOCAINE HCL (CARDIAC) PF 100 MG/5ML IV SOSY
PREFILLED_SYRINGE | INTRAVENOUS | Status: DC | PRN
  Administered 2023-12-30: 100 mg via INTRAVENOUS

## 2023-12-30 NOTE — Op Note (Signed)
 [facility] Patient Name: Susan Howard Procedure Date: 12/30/2023 7:58 AM MRN: [id number] Account #: [id number] Date of Birth: [birth year] Admit Type: Outpatient Age: 53 Room: [facility] ENDO ROOM 1 Gender: Female Note Status: Finalized Instrument Name: Barnie GI Scope [id number] Procedure:             Upper GI endoscopy Indications:           Dysphagia Providers:             Ruel Kung MD, MD Referring MD:          [facility] (Referring MD) Medicines:             Monitored Anesthesia Care Complications:         No immediate complications. Procedure:             Pre-Anesthesia Assessment:                        - The risks and benefits of the procedure and the                         sedation options and risks were discussed with the                         patient. All questions were answered and informed                         consent was obtained.                        - ASA Grade Assessment: II - A patient with mild                         systemic disease.                        After obtaining informed consent, the endoscope was                         passed under direct vision. Throughout the procedure,                         the patient's blood pressure, pulse, and oxygen                         saturations were monitored continuously. The Endoscope                         was introduced through the mouth, and advanced to the                         third part of duodenum. The upper GI endoscopy was                         accomplished with ease. The patient tolerated the                         procedure well. Findings:      The examined duodenum was normal.      The gastroesophageal flap valve was visualized endoscopically and  classified as Hill Grade III (minimal fold, loose to endoscope, hiatal       hernia likely).      A non-obstructing and mild Schatzki ring was found at the       gastroesophageal junction. A TTS  dilator was passed through the scope.       Dilation with a 15-16.5-18 mm balloon dilator was performed to 18 mm.       The dilation site was examined following endoscope reinsertion and       showed no change. Biopsies were taken with a cold forceps for histology. Impression:            - Normal examined duodenum.                        - Gastroesophageal flap valve classified as Hill Grade                         III (minimal fold, loose to endoscope, hiatal hernia                         likely).                        - Non-obstructing and mild Schatzki ring. Dilated.                         Biopsied. Recommendation:        - Discharge patient to home (with escort).                        - Resume previous diet.                        - Continue present medications.                        - Await pathology results.                        - Return to my office PRN. Procedure Code(s):     --- Professional ---                        [id number], Esophagogastroduodenoscopy, flexible,                         transoral; with transendoscopic balloon dilation of                         esophagus (less than 30 mm diameter)                        43239, 59, Esophagogastroduodenoscopy, flexible,                         transoral; with biopsy, single or multiple Diagnosis Code(s):     --- Professional ---                        K22.2, Esophageal obstruction                        R13.10, Dysphagia, unspecified CPT  copyright 2022 American Medical Association. All rights reserved. The codes documented in this report are preliminary and upon coder review may  be revised to meet current compliance requirements. Ruel Kung, MD Ruel Kung MD, MD 12/30/2023 8:25:16 AM This report has been signed electronically. Number of Addenda: 0 Note Initiated On: 12/30/2023 7:58 AM Estimated Blood Loss:  Estimated blood loss: none.      [facility]

## 2023-12-30 NOTE — Anesthesia Preprocedure Evaluation (Addendum)
 Anesthesia Evaluation  Patient identified by MRN, date of birth, ID band Patient awake    Reviewed: Allergy & Precautions, H&P , NPO status , Patient's Chart, lab work & pertinent test results  Airway Mallampati: II  TM Distance: >3 FB     Dental no notable dental hx.  Braces:   Pulmonary neg pulmonary ROS   Pulmonary exam normal breath sounds clear to auscultation       Cardiovascular negative cardio ROS Normal cardiovascular exam Rhythm:Regular Rate:Normal     Neuro/Psych negative neurological ROS  negative psych ROS   GI/Hepatic Neg liver ROS,GERD  Medicated,,  Endo/Other  negative endocrine ROS    Renal/GU      Musculoskeletal   Abdominal   Peds  Hematology negative hematology ROS (+)   Anesthesia Other Findings Past Medical History: No date: Family history of breast cancer     Comment:  declined BRCA testing 2013 11/09/2018: Iron  deficiency anemia due to chronic blood loss No date: MRSA (methicillin resistant Staphylococcus aureus) No date: Spina bifida (HCC) 2006: Status post implantation of artificial urinary sphincter     Comment:  neurogenic bladder 12/2016: UTI (urinary tract infection)   Reproductive/Obstetrics negative OB ROS                              Anesthesia Physical Anesthesia Plan  ASA: 2  Anesthesia Plan: General   Post-op Pain Management: Minimal or no pain anticipated   Induction: Intravenous  PONV Risk Score and Plan: 2 and Propofol  infusion and TIVA  Airway Management Planned: Nasal Cannula  Additional Equipment: None  Intra-op Plan:   Post-operative Plan:   Informed Consent: I have reviewed the patients History and Physical, chart, labs and discussed the procedure including the risks, benefits and alternatives for the proposed anesthesia with the patient or authorized representative who has indicated his/her understanding and acceptance.      Dental advisory given  Plan Discussed with: CRNA and Surgeon  Anesthesia Plan Comments: (Discussed risks of anesthesia with patient, including possibility of difficulty with spontaneous ventilation under anesthesia necessitating airway intervention, PONV, and rare risks such as cardiac or respiratory or neurological events, and allergic reactions. Discussed the role of CRNA in patient's perioperative care. Patient understands.)         Anesthesia Quick Evaluation

## 2023-12-30 NOTE — Anesthesia Postprocedure Evaluation (Signed)
 Anesthesia Post Note  Patient: Kadynce L Zylka  Procedure(s) Performed: EGD (ESOPHAGOGASTRODUODENOSCOPY)  Patient location during evaluation: Endoscopy Anesthesia Type: General Level of consciousness: awake and alert Pain management: pain level controlled Vital Signs Assessment: post-procedure vital signs reviewed and stable Respiratory status: spontaneous breathing, nonlabored ventilation, respiratory function stable and patient connected to nasal cannula oxygen Cardiovascular status: blood pressure returned to baseline and stable Postop Assessment: no apparent nausea or vomiting Anesthetic complications: no   No notable events documented.   Last Vitals:  Vitals:   12/30/23 0835 12/30/23 0845  BP: (!) 137/90 (!) 159/109  Pulse: 64 (!) 50  Resp: (!) 22 14  Temp:    SpO2: 98% 100%    Last Pain:  Vitals:   12/30/23 0845  TempSrc:   PainSc: 0-No pain                 Debby Mines

## 2023-12-30 NOTE — H&P (Signed)
 Ruel Kung , MD [address], [place], [place], [address] Phone: [phone] Fax: [phone]  Primary Care Physician:  Cletus Glenn   Pre-Procedure History & Physical: HPI:  Susan Howard is a 53 y.o. female is here for an endoscopy    Past Medical History:  Diagnosis Date   Family history of breast cancer    declined BRCA testing 2013   Iron  deficiency anemia due to chronic blood loss 11/09/2018   MRSA (methicillin resistant Staphylococcus aureus)    Spina bifida (HCC)    Status post implantation of artificial urinary sphincter 2006   neurogenic bladder   UTI (urinary tract infection) 12/2016    Past Surgical History:  Procedure Laterality Date   BACK SURGERY     for spinal bifida   CESAREAN SECTION  [phone]   G4P4   COLONOSCOPY WITH PROPOFOL  N/A 06/07/2020   Procedure: COLONOSCOPY WITH PROPOFOL ;  Surgeon: Kung Ruel, MD;  Location: [facility] ENDOSCOPY;  Service: Gastroenterology;  Laterality: N/A;  C-19 TEST  AM ON 06/06/2020   FOOT SURGERY     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 Last revision 09/2014    Prior to Admission medications   Medication Sig Start Date End Date Taking? Authorizing Provider  omeprazole  (PRILOSEC) 40 MG capsule Take 40 mg by mouth daily.   Yes [provider]  ipratropium (ATROVENT ) 0.03 % nasal spray Place 2 sprays into both nostrils every 12 (twelve) hours. 11/22/23   Teresa Shelba SAUNDERS, NP  Multiple Vitamin (MULTIVITAMIN) capsule Take 1 capsule by mouth daily.    [provider]  nitrofurantoin , macrocrystal-monohydrate, (MACROBID ) 100 MG capsule Take 1 capsule (100 mg total) by mouth 2 (two) times daily. Patient not taking: Reported on 12/23/2023 02/01/23    Teresa Shelba SAUNDERS, NP    Allergies as of 11/02/2023 - Review Complete 02/01/2023  Allergen Reaction Noted   Latex Other (See Comments) and Rash 09/27/2014   Other Itching and Other (See Comments)    Clindamycin/lincomycin Rash 09/27/2014   Septra [sulfamethoxazole-trimethoprim] Rash 09/27/2014    Family History  Problem Relation Age of Onset   Breast cancer Mother 54   Hypertension Mother    Leukemia Father 65   Neural tube defect Son        spinal bifida   Breast cancer Maternal Grandmother 40   Hypertension Maternal Grandmother    Heart disease Maternal Grandfather    Heart attack Maternal Grandfather 20    Social History   Socioeconomic History   Marital status: Married    Spouse name: Not on file   Number of children: 4   Years of education: Bachelor's   Highest education level: Not on file  Occupational History   Occupation: Charity fundraiser  Tobacco Use   Smoking status: Never   Smokeless tobacco: Never  Vaping Use   Vaping status: Never Used  Substance and Sexual Activity   Alcohol use: No    Alcohol/week: 0.0 standard drinks of alcohol   Drug use: No   Sexual activity: Yes    Partners: Male    Birth control/protection: Surgical    Comment: tubal ligation  Other Topics Concern   Not on file  Social History Narrative   Not on file  Social Drivers of Corporate investment banker Strain: Low Risk  (11/26/2023)   Received from [facility]   Overall Financial Resource Strain (CARDIA)    Difficulty of Paying Living Expenses: Not hard at all  Food Insecurity: No Food Insecurity (11/26/2023)   Received from [facility]   Hunger Vital Sign    Within the past 12 months, you worried that your food would run out before you got the money to buy more.: Never true    Within the past 12 months, the food you bought just didn't last and you didn't have money to get more.: Never true  Transportation Needs: No Transportation Needs (11/26/2023)    Received from [facility] - Transportation    In the past 12 months, has lack of transportation kept you from medical appointments or from getting medications?: No    Lack of Transportation (Non-Medical): No  Physical Activity: Sufficiently Active (03/06/2020)   Exercise Vital Sign    Days of Exercise per Week: 6 days    Minutes of Exercise per Session: 60 min  Stress: No Stress Concern Present (03/06/2020)   [employer] of Occupational Health - Occupational Stress Questionnaire    Feeling of Stress : Not at all  Social Connections: Moderately Integrated (03/06/2020)   Social Connection and Isolation Panel    Frequency of Communication with Friends and Family: More than three times a week    Frequency of Social Gatherings with Friends and Family: More than three times a week    Attends Religious Services: More than 4 times per year    Active Member of [employer] or Organizations: No    Attends Banker Meetings: Never    Marital Status: Married  Catering manager Violence: Not At Risk (03/06/2020)   Humiliation, Afraid, Rape, and Kick questionnaire    Fear of Current or Ex-Partner: No    Emotionally Abused: No    Physically Abused: No    Sexually Abused: No    Review of Systems: See HPI, otherwise negative ROS  Physical Exam: BP (!) 158/95   Pulse 67   Temp (!) 97.1 F (36.2 C) (Tympanic)   Resp 20   Ht 5' 1.5 (1.562 m)   Wt 49.1 kg   LMP  (LMP Unknown)   SpO2 100%   BMI 20.11 kg/m  General:   Alert,  pleasant and cooperative in NAD Head:  Normocephalic and atraumatic. Neck:  Supple; no masses or thyromegaly. Lungs:  Clear throughout to auscultation, normal respiratory effort.    Heart:  +S1, +S2, Regular rate and rhythm, No edema. Abdomen:  Soft, nontender and nondistended. Normal bowel sounds, without guarding, and without rebound.   Neurologic:  Alert and  oriented x4;  grossly normal  neurologically.  Impression/Plan: Susan Howard is here for an endoscopy  to be performed for  evaluation of dysphagia    Risks, benefits, limitations, and alternatives regarding endoscopy have been reviewed with the patient.  Questions have been answered.  All parties agreeable.   Ruel Kung, MD  12/30/2023, 8:11 AM

## 2023-12-30 NOTE — Transfer of Care (Signed)
 Immediate Anesthesia Transfer of Care Note  Patient: Susan Howard  Procedure(s) Performed: EGD (ESOPHAGOGASTRODUODENOSCOPY)  Patient Location: PACU  Anesthesia Type:MAC  Level of Consciousness: drowsy  Airway & Oxygen Therapy: Patient Spontanous Breathing  Post-op Assessment: Report given to RN and Post -op Vital signs reviewed and stable  Post vital signs: stable  Last Vitals:  Vitals Value Taken Time  BP 107/74 12/30/23 08:25  Temp 36.1 C 12/30/23 08:25  Pulse 58 12/30/23 08:25  Resp 22 12/30/23 08:25  SpO2 97 % 12/30/23 08:25    Last Pain:  Vitals:   12/30/23 0825  TempSrc: Temporal  PainSc: Asleep         Complications: No notable events documented.

## 2023-12-31 LAB — SURGICAL PATHOLOGY

## 2024-03-15 ENCOUNTER — Encounter: Payer: Self-pay | Admitting: Oncology
